# Patient Record
Sex: Male | Born: 1950 | Race: Black or African American | Hispanic: No | Marital: Married | Smoking: Former smoker
Health system: Southern US, Community
[De-identification: ages and names within clinical notes are randomized; demographics above are authoritative.]

## PROBLEM LIST (undated history)

## (undated) DIAGNOSIS — C051 Malignant neoplasm of soft palate: Secondary | ICD-10-CM

## (undated) DIAGNOSIS — K219 Gastro-esophageal reflux disease without esophagitis: Secondary | ICD-10-CM

## (undated) DIAGNOSIS — T85848A Pain due to other internal prosthetic devices, implants and grafts, initial encounter: Secondary | ICD-10-CM

## (undated) DIAGNOSIS — I10 Essential (primary) hypertension: Secondary | ICD-10-CM

## (undated) DIAGNOSIS — N189 Chronic kidney disease, unspecified: Secondary | ICD-10-CM

## (undated) DIAGNOSIS — C61 Malignant neoplasm of prostate: Secondary | ICD-10-CM

## (undated) DIAGNOSIS — I219 Acute myocardial infarction, unspecified: Secondary | ICD-10-CM

## (undated) DIAGNOSIS — Z923 Personal history of irradiation: Secondary | ICD-10-CM

## (undated) DIAGNOSIS — J449 Chronic obstructive pulmonary disease, unspecified: Secondary | ICD-10-CM

## (undated) DIAGNOSIS — I4891 Unspecified atrial fibrillation: Secondary | ICD-10-CM

## (undated) DIAGNOSIS — E039 Hypothyroidism, unspecified: Secondary | ICD-10-CM

## (undated) DIAGNOSIS — I739 Peripheral vascular disease, unspecified: Secondary | ICD-10-CM

## (undated) DIAGNOSIS — I6521 Occlusion and stenosis of right carotid artery: Secondary | ICD-10-CM

## (undated) DIAGNOSIS — I509 Heart failure, unspecified: Secondary | ICD-10-CM

## (undated) DIAGNOSIS — C3490 Malignant neoplasm of unspecified part of unspecified bronchus or lung: Secondary | ICD-10-CM

## (undated) DIAGNOSIS — D509 Iron deficiency anemia, unspecified: Secondary | ICD-10-CM

## (undated) DIAGNOSIS — I6529 Occlusion and stenosis of unspecified carotid artery: Secondary | ICD-10-CM

## (undated) HISTORY — PX: TRANSURETHRAL RESECTION OF BLADDER TUMOR WITH GYRUS (TURBT-GYRUS): SHX6458

## (undated) HISTORY — DX: Occlusion and stenosis of unspecified carotid artery: I65.29

## (undated) HISTORY — PX: CATARACT EXTRACTION: SUR2

---

## 1979-12-31 HISTORY — PX: AORTO-FEMORAL BYPASS GRAFT: SHX885

## 1997-12-30 DIAGNOSIS — C051 Malignant neoplasm of soft palate: Secondary | ICD-10-CM

## 1997-12-30 DIAGNOSIS — Z923 Personal history of irradiation: Secondary | ICD-10-CM

## 1997-12-30 HISTORY — DX: Malignant neoplasm of soft palate: C05.1

## 1997-12-30 HISTORY — DX: Personal history of irradiation: Z92.3

## 1998-08-22 ENCOUNTER — Emergency Department (HOSPITAL_COMMUNITY): Admission: EM | Admit: 1998-08-22 | Discharge: 1998-08-22 | Payer: Self-pay | Admitting: Emergency Medicine

## 2000-12-30 HISTORY — PX: PSEUDOANEURYSM REPAIR: SHX2272

## 2001-10-09 ENCOUNTER — Emergency Department (HOSPITAL_COMMUNITY): Admission: EM | Admit: 2001-10-09 | Discharge: 2001-10-09 | Payer: Self-pay | Admitting: Emergency Medicine

## 2001-10-11 ENCOUNTER — Emergency Department (HOSPITAL_COMMUNITY): Admission: EM | Admit: 2001-10-11 | Discharge: 2001-10-11 | Payer: Self-pay

## 2002-08-10 ENCOUNTER — Encounter: Payer: Self-pay | Admitting: Internal Medicine

## 2002-08-10 ENCOUNTER — Ambulatory Visit (HOSPITAL_COMMUNITY): Admission: RE | Admit: 2002-08-10 | Discharge: 2002-08-10 | Payer: Self-pay | Admitting: Internal Medicine

## 2002-09-25 DIAGNOSIS — I509 Heart failure, unspecified: Secondary | ICD-10-CM

## 2002-09-25 DIAGNOSIS — I219 Acute myocardial infarction, unspecified: Secondary | ICD-10-CM

## 2002-09-25 HISTORY — DX: Heart failure, unspecified: I50.9

## 2002-09-25 HISTORY — DX: Acute myocardial infarction, unspecified: I21.9

## 2005-06-24 ENCOUNTER — Emergency Department (HOSPITAL_COMMUNITY): Admission: EM | Admit: 2005-06-24 | Discharge: 2005-06-24 | Payer: Self-pay | Admitting: Emergency Medicine

## 2005-06-29 ENCOUNTER — Emergency Department (HOSPITAL_COMMUNITY): Admission: EM | Admit: 2005-06-29 | Discharge: 2005-06-29 | Payer: Self-pay | Admitting: Emergency Medicine

## 2006-01-10 ENCOUNTER — Emergency Department (HOSPITAL_COMMUNITY): Admission: EM | Admit: 2006-01-10 | Discharge: 2006-01-10 | Payer: Self-pay | Admitting: Emergency Medicine

## 2008-04-04 ENCOUNTER — Ambulatory Visit (HOSPITAL_COMMUNITY): Admission: RE | Admit: 2008-04-04 | Discharge: 2008-04-04 | Payer: Self-pay | Admitting: Internal Medicine

## 2008-08-22 ENCOUNTER — Encounter: Payer: Self-pay | Admitting: Pulmonary Disease

## 2008-08-22 ENCOUNTER — Ambulatory Visit: Payer: Self-pay | Admitting: Internal Medicine

## 2008-08-23 ENCOUNTER — Encounter: Payer: Self-pay | Admitting: Pulmonary Disease

## 2008-08-29 ENCOUNTER — Ambulatory Visit: Payer: Self-pay | Admitting: Pulmonary Disease

## 2008-08-29 DIAGNOSIS — Z9189 Other specified personal risk factors, not elsewhere classified: Secondary | ICD-10-CM | POA: Insufficient documentation

## 2008-08-29 DIAGNOSIS — R0602 Shortness of breath: Secondary | ICD-10-CM | POA: Insufficient documentation

## 2008-08-29 DIAGNOSIS — C760 Malignant neoplasm of head, face and neck: Secondary | ICD-10-CM

## 2008-09-08 ENCOUNTER — Emergency Department (HOSPITAL_COMMUNITY): Admission: EM | Admit: 2008-09-08 | Discharge: 2008-09-08 | Payer: Self-pay | Admitting: Emergency Medicine

## 2010-10-08 ENCOUNTER — Emergency Department (HOSPITAL_COMMUNITY)
Admission: EM | Admit: 2010-10-08 | Discharge: 2010-10-08 | Payer: Self-pay | Source: Home / Self Care | Admitting: Emergency Medicine

## 2011-03-14 LAB — POCT CARDIAC MARKERS
CKMB, poc: <1 ng/mL — ABNORMAL LOW (ref 1.0–8.0)
Troponin i, poc: <0.05 ng/mL (ref 0.00–0.09)

## 2011-03-14 LAB — BASIC METABOLIC PANEL
Calcium: 8.8 mg/dL (ref 8.4–10.5)
Creatinine, Ser: 0.8 mg/dL (ref 0.4–1.5)
GFR calc Af Amer: >60 mL/min (ref >60)

## 2011-03-14 LAB — CBC
Platelets: 246 10*3/uL (ref 150–400)
RBC: 4.02 MIL/uL — ABNORMAL LOW (ref 4.22–5.81)
WBC: 6.5 10*3/uL (ref 4.0–10.5)

## 2011-05-17 NOTE — Consult Note (Signed)
NAME:  Bryan Arellano, Bryan Arellano                          ACCOUNT NO.:  [id number]   MEDICAL RECORD NO.:  [id number]                   PATIENT TYPE:  OUT   LOCATION:  XRAY                                 FACILITY:  [facility]   PHYSICIAN:  James L. Deterding, M.D.            DATE OF BIRTH:  1951/06/03   DATE OF CONSULTATION:  DATE OF DISCHARGE:  08/10/2002                                   CONSULTATION   CONSULTATIONS:  [facility].   REASON FOR CONSULTATION:  Acute and chronic renal insufficiency.   HISTORY OF PRESENT ILLNESS:  This is an 60 year old gentleman who has a  longstanding history of peripheral vascular disease status-post aorta  femoral bypass in 1981, history of pseudoaneurysm resection in 2002, history  of DJD, hypertension, hypothyroidism, history of lung cancer in the distant  past, history of transurethral resection for bladder carcinoma in the past,  history of cataract surgery, history of gastroesophageal reflux disease,  peptic ulcer disease, COPD, hypertension, dyslipidemia. He does not know  about the history of hypertension, but thinks he might be.   He presented on September 25, 2002, with an acute MI and congestive heart  failure. His creatinine was 3.3 on admission and has now settled at 2.4. His  creatinine was 1.7 in August 2002. He has a history of being on Univasc at  home and no nonsteroidal ingestion. 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  Previously he had ongoing  angina and need for catheterization. He has a  positive family history of diabetes, of CVA, COPD. His mother had renal  disease; we do know the definition of that in terms of severity and cause.   REVIEW OF SYSTEMS:  HEENT:  He says he can see fairly well and read  pretty  well and does have some blurring of vision but not on a severe basis. No  headaches. CARDIOVASCULAR:  He had the chest pain when he came in with  congestive heart failure. He usually sleeps on two pillows. Has nocturia x 4  to 5 at home. No dysuria. He notes no ankle edema most recently. He has had  no PND or orthopnea most recently. Previously acute onset of chest pain as  presented in initial history.  EXTREMITIES:  His legs do get tired, but he thinks it is mostly joints that  bother him when he walks about one to two blocks. He rests 10 minutes and it  is better, however. GI: He has indigestion and heart burn. He takes Pepcid  on a regular basis without any bloody or black stools. He is constipated  here. MUSCULOSKELETAL:  He has pain in his hands, hips, knees that has been  a chronic problem. He takes Tylenol on an intermittent basis for that. No  nonsteroidals. SKIN:  He notes that he bruises quite easily and thinks this  related  to the blood thinner here, but at home he has some bruises but not  like he does here.  PSYCHOLOGICAL:  Negative.   FAMILY HISTORY:  As noted above. Diabetes, CVA, COPD, CHF, two siblings with  coronary  disease.  One born with cancer. One who died of an unknown  cause.   SOCIAL HISTORY:  He  discontinued smoking 20 years ago. He is retired from  [employer]. Occasional alcohol in the past,  none recently.   MEDICATIONS:  Included on admission:  1. Univasc 15 mg.  2. __________ 19 mg q.d.  3. Verapamil 120 mg q.d.  4. __________ 0.l mg q.d.  5. Pepcid 20 mg b.i.d.  6. Benadryl 50 mg q.h.s.  7. Enteric coated aspirin 81 mg three days a week.  8. Simvastatin 40 mg q.d.  9. Vitamin C 1 gm q.d.   PHYSICAL EXAMINATION:  GENERAL:  A very pleasant elderly gentleman who is a  somewhat vague historian.  VITAL SIGNS:  Blood pressure 134/58, going to 118/50.  HEENT:  Fundi are benign. Pharynx unremarkable.  NECK:  Shotty posterior cervical adenopathy. He has bilateral carotid  bruits.  CARDIOVASCULAR:  Regular rhythm, S4, a grade 2/6 holosystolic murmur heard  best at the apex. A 2/6 diastolic murmur  heard best at the upper left  sternal border. Bilateral  femoral bruits. He has midline abdominal,  bilateral, frank bruits louder on the left than on the right. Bilateral  carotid bruits. He has a right femoral pseudoaneurysm.  LUNGS:  Reveal no rales, rhonchi or wheezes. Breath sounds are decreased.  Decreased expansion.  ABDOMEN:  Positive bowel sounds, soft.  SKIN:  Shows some bruises.  He has some seborrheic keratosis. He also has a  sebaceous cyst on his back.  MUSCULOSKELETAL:  Reveals hypertrophic changes in the hands. He has trophic  changes in both feet in terms of __________  appendages. No dorsalis pedis  pulses found.   LABORATORY DATA:  Hemoglobin was 8.4, white count 10,700, platelets 111,000.  Iron sat 11%. Sodium 138, potassium 4, chloride 105,  bicarbonate 22,  creatinine 2.4, BUN 71, glucose 99.   ASSESSMENT:  1. Chronic renal failure and acute renal failure. Chronic disease is mostly     likely nephrosclerosis or vascular disease, based on his creatinine of     1.7 in August 2002, which I  expect to be worse now. Creatinine clearance     was a creatinine of 1.7, for him it is only 39 cc. Creatinine clearance     showed a creatinine of 2.6 that is 25 cc a minute. He has severe disease,     class 4 at best by the NKF classification. We need to rule out     obstruction, dysprotienuria or tubular interstitial disease also.   His acute renal disease is secondary to decreased renal blood flow in the  setting of CHF, which improved at diuresis. Question if an ACE inhibitor  had some role in prohibiting his autoregulatory reaction to the congestive  heart failure, and he has some chronic problem also, and he may get a little  better yet.   Very low GFR contributing it to his anemia, acidosis, need to rule out  secondary to hypothyroidism. The risk for catheterization is over 25% with  worsening of renal function or he could die of atheroembolic disease.  He does need  the catheterization.  1. Acute MI. He needs the catheterization ongoing instent arrhythmias.  2. Anemia, iron deficiency  as well as chronic disease from his renal disease     and need for iron at this time and EPO.  3. Peripheral vascular disease, rule out renal artery stenosis.  4. COPD.  5. History of prostate carcinoma.  6. History of lung cancer.   PLAN:  1. Ultrasound.  2. Repeat urinalysis.  3. Urine sodium creatinine.  4. Hold his ACE inhibitor.  5.     Check a PTH.  6. Intravenous  iron.  7. Check his stools.                                               James L. Deterding, M.D.    JLD/MEDQ  D:  09/29/2002  T:  10/03/2002  Job:  [id number]

## 2011-09-12 ENCOUNTER — Other Ambulatory Visit: Payer: Self-pay | Admitting: Internal Medicine

## 2011-09-12 ENCOUNTER — Ambulatory Visit
Admission: RE | Admit: 2011-09-12 | Discharge: 2011-09-12 | Disposition: A | Discharge status: Home / Self Care | Payer: Self-pay | Source: Ambulatory Visit | Attending: Internal Medicine | Admitting: Internal Medicine

## 2011-09-12 DIAGNOSIS — R0600 Dyspnea, unspecified: Secondary | ICD-10-CM

## 2011-09-12 MED ORDER — IOHEXOL 300 MG/ML  SOLN
75.0000 mL | Freq: Once | INTRAMUSCULAR | Status: AC | PRN
  Administered 2011-09-12: 75 mL via INTRAVENOUS

## 2011-09-18 ENCOUNTER — Other Ambulatory Visit: Payer: Self-pay | Admitting: Internal Medicine

## 2011-09-18 DIAGNOSIS — R591 Generalized enlarged lymph nodes: Secondary | ICD-10-CM

## 2011-09-18 DIAGNOSIS — R0602 Shortness of breath: Secondary | ICD-10-CM

## 2011-09-19 ENCOUNTER — Ambulatory Visit
Admission: RE | Admit: 2011-09-19 | Discharge: 2011-09-19 | Disposition: A | Discharge status: Home / Self Care | Payer: Medicare Other | Source: Ambulatory Visit | Attending: Internal Medicine | Admitting: Internal Medicine

## 2011-09-19 DIAGNOSIS — R591 Generalized enlarged lymph nodes: Secondary | ICD-10-CM

## 2011-09-19 DIAGNOSIS — R0602 Shortness of breath: Secondary | ICD-10-CM

## 2011-09-19 MED ORDER — IOHEXOL 300 MG/ML  SOLN
100.0000 mL | Freq: Once | INTRAMUSCULAR | Status: AC | PRN
  Administered 2011-09-19: 100 mL via INTRAVENOUS

## 2011-12-31 HISTORY — PX: GASTROSTOMY TUBE PLACEMENT: SHX655

## 2012-01-07 ENCOUNTER — Other Ambulatory Visit (HOSPITAL_COMMUNITY): Payer: Self-pay | Admitting: *Deleted

## 2012-01-08 ENCOUNTER — Other Ambulatory Visit (HOSPITAL_COMMUNITY): Payer: Medicare Other

## 2012-01-08 ENCOUNTER — Ambulatory Visit (HOSPITAL_COMMUNITY)
Admission: RE | Admit: 2012-01-08 | Discharge: 2012-01-08 | Disposition: A | Discharge status: Home / Self Care | Payer: Medicare Other | Source: Ambulatory Visit | Attending: Internal Medicine | Admitting: Internal Medicine

## 2012-01-08 DIAGNOSIS — R1310 Dysphagia, unspecified: Secondary | ICD-10-CM | POA: Insufficient documentation

## 2012-01-08 DIAGNOSIS — R1313 Dysphagia, pharyngeal phase: Secondary | ICD-10-CM | POA: Insufficient documentation

## 2012-01-08 NOTE — Procedures (Signed)
Modified Barium Swallow Procedure Note Patient Details  Name: Bryan Arellano MRN: [id number] Date of Birth: 23-May-1951  Today's Date: 01/08/2012 Time:  -     Past Medical History: No past medical history on file. Past Surgical History: No past surgical history on file. HPI:   HPI: Pt is a 61 year old male arriving for an outpatient MBS due to complaints that food gets stuck and is then expectorated. 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 She also admits that the pts has been very weak over the last three weeks and became suddenly unable to swallow over this time. The pt does not demonstrate any signs of focal facial, lingual, labial or UE weakness. His velum appears flaccid on the right and does not make contact with the posterior pharyngeal wall.   Recommendation/Prognosis  Clinical Impression Dysphagia Diagnosis: Severe pharyngeal phase dysphagia Clinical impression: Pt presents with a severe pharyngeal dysphagia characterized by sensory motor deficits. Pt with reduced laryngeal elevation and excursion, base of tongue retraction and epiglottic inversion. Liquids fall to pyriform sinuses and are aspirated before swallow initiated and significant vallecualr residuals aspirated after the swallow. Gross aspiration is silent and not fully expelled even with a hard cough. Wtih nectar thick liquids only trace penetration occured from residuals after the swallow. Residuals improve with a head turn to the right. Feel this pt with a new neurological deficit resulting in an increase in a possible mild baseline dysphagia. Pt with severe aspiration risk and will need to consume nectar thick liquids and soft (dysphagia 3) solids with  a head turn to the right. Pt recommended to follow up with MD for further assessment.   Swallow Recommendations  Solid Consistency: Dysphagia 3 (Mechanical soft)  Liquid Consistency: Nectar  Liquid Administration via: Cup  Medication Administration: Whole meds with liquid  Supervision: Patient able to self feed  Compensations: Slow rate;Small sips/bites;Multiple dry swallows after each bite/sip;Clear throat intermittently  Postural Changes and/or Swallow Maneuvers: Seated upright 90 degrees;Head turn right during swallow  Follow up Recommendations: Home health SLP Prognosis Prognosis for Safe Diet Advancement: Fair Barriers to Reach Goals: Cognitive deficits;Severity of dysphagia Individuals Consulted Consulted and Agree with Results and Recommendations: Patient;Family member/caregiver;MD Family Member Consulted: wife Report Sent to : Referring physician  General:   Type of Study: Initial MBS Diet Prior to this Study: Dysphagia 3 (soft);Thin liquids Temperature Spikes Noted: No Respiratory Status: Room air Behavior/Cognition: Alert;Cooperative Oral Cavity - Dentition: Edentulous Oral Motor / Sensory Function: Impaired motor Oral impairment:  (right side of velum) Vision: Functional for self-feeding Patient Positioning: Upright in chair Baseline Vocal Quality: Other (comment) (hypernasal resonance) Volitional Cough: Strong Volitional Swallow: Able to elicit Pharyngeal Secretions: Standing secretions in (comment) (posterior pharynx) Ice chips: Not tested   Oral Phase Oral Preparation/Oral Phase Oral Phase: WFL Pharyngeal Phase  Pharyngeal Phase Pharyngeal Phase: Impaired Pharyngeal - Nectar Pharyngeal - Nectar Cup: Delayed swallow initiation;Reduced pharyngeal peristalsis;Reduced epiglottic inversion;Reduced anterior laryngeal mobility;Reduced laryngeal elevation;Reduced airway/laryngeal closure;Reduced tongue base retraction;Penetration/Aspiration after swallow;Pharyngeal  residue - valleculae;Pharyngeal residue - posterior pharnyx;Compensatory strategies attempted (Comment) (Head turn to right) Penetration/Aspiration details (nectar cup): Material enters airway, remains ABOVE vocal cords and not ejected out Pharyngeal - Thin  Pharyngeal - Thin Cup: Delayed swallow initiation;Premature spillage to pyriform;Reduced airway/laryngeal closure;Reduced laryngeal elevation;Reduced anterior laryngeal mobility;Reduced pharyngeal peristalsis;Reduced epiglottic inversion;Reduced tongue base retraction;Penetration/Aspiration before swallow;Penetration/Aspiration during swallow;Significant aspiration (Amount);Pharyngeal residue - valleculae;Pharyngeal residue - posterior pharnyx;Compensatory strategies attempted (Comment) (chin tuck, head turn left, head turn right) Penetration/Aspiration details (thin cup): Material enters airway, passes BELOW cords without attempt by patient to eject out (silent aspiration) Pharyngeal - Solids Pharyngeal - Puree: Pharyngeal residue - valleculae;Pharyngeal residue - posterior pharnyx;Reduced anterior laryngeal mobility;Reduced laryngeal elevation;Reduced airway/laryngeal closure;Reduced tongue base retraction;Reduced epiglottic inversion;Reduced pharyngeal peristalsis;Compensatory strategies attempted (Comment) (head turn right) Pharyngeal - Mechanical Soft: Reduced epiglottic inversion;Reduced anterior laryngeal mobility;Reduced laryngeal elevation;Reduced pharyngeal peristalsis;Reduced airway/laryngeal closure;Reduced tongue base retraction;Pharyngeal residue - valleculae;Pharyngeal residue - posterior pharnyx (head turn right) Pharyngeal - Pill: Reduced pharyngeal peristalsis;Reduced epiglottic inversion;Reduced anterior laryngeal mobility;Reduced laryngeal elevation;Reduced airway/laryngeal closure;Reduced tongue base retraction Cervical Esophageal Phase  Cervical Esophageal Phase Cervical Esophageal Phase: [facility]   Harlon Ditty, MA CCC-SLP  [phone]   Jiali Linney, Riley Nearing 01/08/2012, 1:55 PM

## 2012-02-27 ENCOUNTER — Ambulatory Visit
Admission: RE | Admit: 2012-02-27 | Discharge: 2012-02-27 | Disposition: A | Discharge status: Home / Self Care | Payer: Medicare Other | Source: Ambulatory Visit | Attending: Internal Medicine | Admitting: Internal Medicine

## 2012-02-27 ENCOUNTER — Other Ambulatory Visit: Payer: Self-pay | Admitting: Internal Medicine

## 2012-02-27 MED ORDER — IOHEXOL 300 MG/ML  SOLN
100.0000 mL | Freq: Once | INTRAMUSCULAR | Status: AC | PRN
  Administered 2012-02-27: 100 mL via INTRAVENOUS

## 2012-02-27 MED ORDER — IOHEXOL 300 MG/ML  SOLN
30.0000 mL | Freq: Once | INTRAMUSCULAR | Status: AC | PRN
  Administered 2012-02-27: 30 mL via ORAL

## 2012-03-24 ENCOUNTER — Encounter: Payer: Medicare Other | Admitting: *Deleted

## 2012-03-25 ENCOUNTER — Ambulatory Visit: Payer: Medicare Other | Attending: Internal Medicine

## 2012-03-25 DIAGNOSIS — R1313 Dysphagia, pharyngeal phase: Secondary | ICD-10-CM | POA: Insufficient documentation

## 2012-03-25 DIAGNOSIS — IMO0001 Reserved for inherently not codable concepts without codable children: Secondary | ICD-10-CM | POA: Insufficient documentation

## 2012-03-30 ENCOUNTER — Ambulatory Visit: Payer: Medicare Other | Attending: Internal Medicine | Admitting: Speech Pathology

## 2012-03-30 DIAGNOSIS — R1313 Dysphagia, pharyngeal phase: Secondary | ICD-10-CM | POA: Insufficient documentation

## 2012-03-30 DIAGNOSIS — IMO0001 Reserved for inherently not codable concepts without codable children: Secondary | ICD-10-CM | POA: Insufficient documentation

## 2012-04-01 ENCOUNTER — Ambulatory Visit: Payer: Medicare Other

## 2012-04-03 ENCOUNTER — Ambulatory Visit: Payer: Medicare Other

## 2012-04-06 ENCOUNTER — Ambulatory Visit: Payer: Medicare Other

## 2012-04-08 ENCOUNTER — Ambulatory Visit: Payer: Medicare Other

## 2012-04-10 ENCOUNTER — Ambulatory Visit: Payer: Medicare Other

## 2012-04-13 ENCOUNTER — Ambulatory Visit: Payer: Medicare Other | Admitting: *Deleted

## 2012-04-15 ENCOUNTER — Ambulatory Visit: Payer: Medicare Other | Admitting: *Deleted

## 2012-04-15 ENCOUNTER — Emergency Department (HOSPITAL_COMMUNITY)
Admission: EM | Admit: 2012-04-15 | Discharge: 2012-04-16 | Disposition: A | Discharge status: Home / Self Care | Payer: Medicare Other | Attending: Emergency Medicine | Admitting: Emergency Medicine

## 2012-04-15 ENCOUNTER — Encounter (HOSPITAL_COMMUNITY): Payer: Self-pay | Admitting: Emergency Medicine

## 2012-04-15 DIAGNOSIS — R109 Unspecified abdominal pain: Secondary | ICD-10-CM | POA: Insufficient documentation

## 2012-04-15 DIAGNOSIS — R1900 Intra-abdominal and pelvic swelling, mass and lump, unspecified site: Secondary | ICD-10-CM | POA: Insufficient documentation

## 2012-04-15 DIAGNOSIS — Z931 Gastrostomy status: Secondary | ICD-10-CM | POA: Insufficient documentation

## 2012-04-15 LAB — HEPATIC FUNCTION PANEL
ALT: 20 U/L (ref 0–53)
Alkaline Phosphatase: 82 U/L (ref 39–117)
Bilirubin, Direct: <0.1 mg/dL (ref 0.0–0.3)

## 2012-04-15 MED ORDER — HYDROMORPHONE HCL PF 1 MG/ML IJ SOLN
1.0000 mg | Freq: Once | INTRAMUSCULAR | Status: AC
  Administered 2012-04-15: 1 mg via INTRAVENOUS
  Filled 2012-04-15: qty 1

## 2012-04-15 MED ORDER — SODIUM CHLORIDE 0.9 % IV BOLUS (SEPSIS)
1000.0000 mL | Freq: Once | INTRAVENOUS | Status: AC
  Administered 2012-04-15: 1000 mL via INTRAVENOUS

## 2012-04-15 MED ORDER — ONDANSETRON HCL 4 MG/2ML IJ SOLN
4.0000 mg | Freq: Once | INTRAMUSCULAR | Status: AC
  Administered 2012-04-15: 4 mg via INTRAVENOUS
  Filled 2012-04-15: qty 2

## 2012-04-15 NOTE — ED Provider Notes (Signed)
History     CSN: [id number]  Arrival date & time 04/15/12  2110   First MD Initiated Contact with Patient 04/15/12 2320      Chief Complaint  Patient presents with  . Abdominal Pain    (Consider location/radiation/quality/duration/timing/severity/associated sxs/prior treatment) HPI Comments: Patient presents with 4 days of abdominal pain and swelling around his gastrostomy tube site. His pain is around the tube in his upper abdomen. He denies any nausea, vomiting, fever, chills. He is still moving his bowels. He has a gastrostomy tube for of swallowing difficulties and has a remote history oral cancer. No fevers, chest pain or shortness of breath. He is to still tolerating his tube feeds. There's been no bleeding or drainage from around the G-tube site  The history is provided by the patient and a relative.    History reviewed. No pertinent past medical history.  Past Surgical History  Procedure Date  . Gastrostomy tube placement     No family history on file.  History  Substance Use Topics  . Smoking status: Never Smoker   . Smokeless tobacco: Not on file  . Alcohol Use: No      Review of Systems  Constitutional: Negative for fever, activity change and appetite change.  HENT: Negative for congestion and rhinorrhea.   Respiratory: Negative for cough, chest tightness and shortness of breath.   Cardiovascular: Negative for chest pain.  Gastrointestinal: Positive for abdominal pain. Negative for nausea, vomiting, diarrhea and constipation.  Genitourinary: Negative for dysuria and hematuria.  Musculoskeletal: Negative for back pain.  Skin: Negative for rash.  Neurological: Negative for dizziness, weakness and headaches.    Allergies  Review of patient's allergies indicates no known allergies.  Home Medications   Current Outpatient Rx  Name Route Sig Dispense Refill  . HYDROCODONE-ACETAMINOPHEN 5-325 MG PO TABS Oral Take 2 tablets by mouth every 4 (four) hours as  needed for pain. 10 tablet 0  . ONDANSETRON HCL 4 MG PO TABS Oral Take 1 tablet (4 mg total) by mouth every 6 (six) hours. 12 tablet 0    BP 153/85  Pulse 72  Temp(Src) 97 F (36.1 C) (Oral)  Resp 16  SpO2 100%  Physical Exam  Constitutional: He is oriented to person, place, and time. He appears well-developed and well-nourished. No distress.  HENT:  Head: Normocephalic and atraumatic.  Mouth/Throat: Oropharynx is clear and moist. No oropharyngeal exudate.  Eyes: Conjunctivae and EOM are normal. Pupils are equal, round, and reactive to light.  Neck: Normal range of motion. Neck supple.  Cardiovascular: Normal rate, regular rhythm and normal heart sounds.   No murmur heard. Pulmonary/Chest: Effort normal and breath sounds normal. No respiratory distress.  Abdominal: Soft. There is tenderness. There is guarding.       Upper quadrant G-tube without drainage or erythema. Tender to palpation around the site diffusely  Musculoskeletal: Normal range of motion. He exhibits no edema and no tenderness.  Neurological: He is alert and oriented to person, place, and time. No cranial nerve deficit.  Skin: Skin is warm.    ED Course  Procedures (including critical care time)  Labs Reviewed  CBC - Abnormal; Notable for the following:    RBC 4.03 (*)    Hemoglobin 12.9 (*)    HCT 37.8 (*)    All other components within normal limits  HEPATIC FUNCTION PANEL  LIPASE, BLOOD  LACTIC ACID, PLASMA  URINALYSIS, ROUTINE W REFLEX MICROSCOPIC  BASIC METABOLIC PANEL  DIFFERENTIAL   Ct  Abdomen Pelvis W Contrast  04/16/2012  *RADIOLOGY REPORT*  Clinical Data: Abdominal swelling for 4 days.  CT ABDOMEN AND PELVIS WITH CONTRAST  Technique:  Multidetector CT imaging of the abdomen and pelvis was performed following the standard protocol during bolus administration of intravenous contrast.  Contrast: 80mL OMNIPAQUE IOHEXOL 300 MG/ML  SOLN  Comparison: CT of the abdomen and pelvis performed 02/27/2012   Findings: Minimal bibasilar atelectasis is noted.  Scattered tiny hypodensities within the liver are too small to further characterize, but may reflect cysts.  These measure up to 6 mm in size.  There is mild nonspecific prominence of the intrahepatic biliary ducts, new from the prior study, without evidence of distal obstruction.  The gallbladder is grossly unremarkable in appearance.  The spleen is within normal limits. The pancreas and adrenal glands are grossly unremarkable.  Scattered small bilateral renal cysts are seen, measuring up to 1.4 cm in size.  The kidneys are otherwise unremarkable in appearance. There is no evidence of hydronephrosis.  No renal or ureteral stones are seen.  A right-sided extrarenal pelvis is seen.  No free fluid is identified.  The small bowel is unremarkable in appearance.  Apparent mild focal gastric wall thickening at the antrum is nonspecific and may simply reflect relative decompression; the patient's G-tube is noted ending at the body of the stomach.  No acute vascular abnormalities are seen.  Diffuse calcification is noted along the abdominal aorta and its branches.  The appendix is normal in caliber and contains contrast, without evidence for appendicitis.  Contrast progresses to the level of the distal transverse colon.  The sigmoid colon is not well assessed due to surrounding bowel structures, but appears grossly unremarkable; the remaining colon is within normal limits.  There is mild soft tissue inflammation noted along the anterior abdominal wall bilaterally, slightly more prominent on the right; this is more prominent than on the prior study, without evidence for abscess.  The bladder is mildly distended; mild diffuse bladder wall thickening could reflect cystitis.  The prostate is borderline normal in size.  No inguinal lymphadenopathy is seen.  No acute osseous abnormalities are identified.  Vacuum phenomenon is noted along the lower lumbar spine.  IMPRESSION:  1.   Slightly more prominent soft tissue inflammation noted along the anterior abdominal wall bilaterally, more evident on the right. No evidence of abscess. 2.  Apparent mild focal gastric wall thickening at the antrum is nonspecific and may simply reflect relative decompression, though mild chronic gastritis could have a similar appearance.  This appearance is stable from prior CTs.  Mass is considered less likely. 3.  Mild bladder wall thickening is relatively stable in appearance, but could reflect mild cystitis or possibly chronic inflammation. 4.  Likely small hepatic cysts again noted; small bilateral renal cysts seen. 5.  Mild nonspecific prominence of the intrahepatic biliary ducts, new from the prior study, without evidence of distal obstruction. 6.  Diffuse calcification along the abdominal aorta and its branches.  Original Report Authenticated By: Tonia Ghent, M.D.     1. Abdominal pain       MDM  Abdominal pain around G-tube site without any nausea or vomiting or diarrhea. Vitals stable, no distress  Lab work unremarkable, urinalysis negative.  CT scan shows soft tissue inflammation of the anterior abdominal wall without clinical evidence of abscess or cellulitis no bowel obstruction. No abscess. No fever or leukocytosis.  Patient has remained stable in the ED. I discussed these results with the patient  and his family and advised followup with his GI doctor in [place].      Glynn Octave, MD 04/16/12 [phone]

## 2012-04-15 NOTE — ED Notes (Signed)
PT. REPORTS ABDOMINAL PAIN / SWELLING AT G-TUBE SITE FOR SEVERAL DAYS , DENIES NAUSEA OR VOMITTING / NO FEVER OR CHILLS.

## 2012-04-15 NOTE — ED Notes (Signed)
Patient's family member out in waiting room yelling at staff; family member upset that patient has been sent back out in waiting room to sit with family rather than put in an exam room.  Family member informed that patient is welcome to sit back in triage area with triage RN until room is available.  Family member requesting pain medication for patient; informed family member that patient must be seen by MD before any medications are given.  Family member became more upset when she was asked to move from check-in registration chairs to the main waiting area -- states, "It's already too crowded over there.  Why do I have to move"?  Asked family member if she would like to speak with charge RN; family member refused.  Will continue to monitor.

## 2012-04-16 ENCOUNTER — Emergency Department (HOSPITAL_COMMUNITY): Payer: Medicare Other

## 2012-04-16 LAB — BASIC METABOLIC PANEL
CO2: 28 mEq/L (ref 19–32)
Calcium: 9.6 mg/dL (ref 8.4–10.5)
Creatinine, Ser: 0.67 mg/dL (ref 0.50–1.35)
Glucose, Bld: 85 mg/dL (ref 70–99)

## 2012-04-16 LAB — URINALYSIS, ROUTINE W REFLEX MICROSCOPIC
Bilirubin Urine: NEGATIVE
Glucose, UA: NEGATIVE mg/dL
Hgb urine dipstick: NEGATIVE
Ketones, ur: NEGATIVE mg/dL
Protein, ur: NEGATIVE mg/dL

## 2012-04-16 LAB — CBC
MCH: 32 pg (ref 26.0–34.0)
MCV: 93.8 fL (ref 78.0–100.0)
Platelets: 182 10*3/uL (ref 150–400)
RBC: 4.03 MIL/uL — ABNORMAL LOW (ref 4.22–5.81)
RDW: 13.6 % (ref 11.5–15.5)

## 2012-04-16 LAB — DIFFERENTIAL
Eosinophils Absolute: 0.1 10*3/uL (ref 0.0–0.7)
Eosinophils Relative: 1 % (ref 0–5)
Lymphs Abs: 1.7 10*3/uL (ref 0.7–4.0)

## 2012-04-16 MED ORDER — ONDANSETRON HCL 4 MG PO TABS: 4.0000 mg | ORAL_TABLET | Freq: Four times a day (QID) | ORAL | Status: AC

## 2012-04-16 MED ORDER — MORPHINE SULFATE 4 MG/ML IJ SOLN
4.0000 mg | Freq: Once | INTRAMUSCULAR | Status: AC
  Administered 2012-04-16: 4 mg via INTRAVENOUS
  Filled 2012-04-16: qty 1

## 2012-04-16 MED ORDER — IOHEXOL 300 MG/ML  SOLN: 20.0000 mL | INTRAMUSCULAR | Status: AC

## 2012-04-16 MED ORDER — IOHEXOL 300 MG/ML  SOLN
80.0000 mL | Freq: Once | INTRAMUSCULAR | Status: AC | PRN
  Administered 2012-04-16: 80 mL via INTRAVENOUS

## 2012-04-16 MED ORDER — HYDROCODONE-ACETAMINOPHEN 5-325 MG PO TABS: 2.0000 | ORAL_TABLET | ORAL | Status: AC | PRN

## 2012-04-16 NOTE — ED Notes (Signed)
Unable to locate correct attachment for patient G tube. Patients wife has gone home to get his personal attachment.  CT notified of delay

## 2012-04-16 NOTE — ED Notes (Signed)
Patient transported to CT 

## 2012-04-16 NOTE — Discharge Instructions (Signed)
Abdominal Pain Follow up with your stomach doctor in [place]. Return to the ED for other worsening symptoms. Abdominal pain can be caused by many things. Your caregiver decides the seriousness of your pain by an examination and possibly blood tests and X-rays. Many cases can be observed and treated at home. Most abdominal pain is not caused by a disease and will probably improve without treatment. However, in many cases, more time must pass before a clear cause of the pain can be found. Before that point, it may not be known if you need more testing, or if hospitalization or surgery is needed. HOME CARE INSTRUCTIONS   Do not take laxatives unless directed by your caregiver.   Take pain medicine only as directed by your caregiver.   Only take over-the-counter or prescription medicines for pain, discomfort, or fever as directed by your caregiver.   Try a clear liquid diet (broth, tea, or water) for as long as directed by your caregiver. Slowly move to a bland diet as tolerated.  SEEK IMMEDIATE MEDICAL CARE IF:   The pain does not go away.   You have a fever.   You keep throwing up (vomiting).   The pain is felt only in portions of the abdomen. Pain in the right side could possibly be appendicitis. In an adult, pain in the left lower portion of the abdomen could be colitis or diverticulitis.   You pass bloody or black tarry stools.  MAKE SURE YOU:   Understand these instructions.   Will watch your condition.   Will get help right away if you are not doing well or get worse.  Document Released: 09/25/2005 Document Revised: 12/05/2011 Document Reviewed: 08/03/2008 [facility] Patient Information 2012 [place], [place].

## 2012-04-17 ENCOUNTER — Ambulatory Visit: Payer: Medicare Other

## 2012-04-18 ENCOUNTER — Emergency Department (HOSPITAL_COMMUNITY)
Admission: EM | Admit: 2012-04-18 | Discharge: 2012-04-18 | Disposition: A | Discharge status: Home / Self Care | Payer: Medicare Other | Attending: Emergency Medicine | Admitting: Emergency Medicine

## 2012-04-18 ENCOUNTER — Encounter (HOSPITAL_COMMUNITY): Payer: Self-pay | Admitting: *Deleted

## 2012-04-18 DIAGNOSIS — K9429 Other complications of gastrostomy: Secondary | ICD-10-CM | POA: Insufficient documentation

## 2012-04-18 DIAGNOSIS — Y849 Medical procedure, unspecified as the cause of abnormal reaction of the patient, or of later complication, without mention of misadventure at the time of the procedure: Secondary | ICD-10-CM | POA: Insufficient documentation

## 2012-04-18 DIAGNOSIS — Z431 Encounter for attention to gastrostomy: Secondary | ICD-10-CM

## 2012-04-18 HISTORY — DX: Pain due to other internal prosthetic devices, implants and grafts, initial encounter: T85.848A

## 2012-04-18 MED ORDER — BACITRACIN ZINC 500 UNIT/GM EX OINT
TOPICAL_OINTMENT | CUTANEOUS | Status: AC
  Filled 2012-04-18: qty 1.8

## 2012-04-18 NOTE — ED Provider Notes (Signed)
History     CSN: [id number]  Arrival date & time 04/18/12  1541   First MD Initiated Contact with Patient 04/18/12 1614      Chief Complaint  Patient presents with  . Abdominal Pain    umbilical area    (Consider location/radiation/quality/duration/timing/severity/associated sxs/prior treatment) Patient is a 61 y.o. male presenting with abdominal pain. The history is provided by the patient and a relative.  Abdominal Pain The primary symptoms of the illness include abdominal pain.   patient here complaining of pain around his G-tube site. No erythema slight bloody drainage. G-tube was placed a year ago. No fever. G-tube is functioning properly. Bleeding has been controlled with 4 x 4's. Patient has had some intermittent abdominal distention, but no vomiting or diarrhea. He is resting comfortably at this time  Past Medical History  Diagnosis Date  . Cancer   . Pain around PEG tube site   . Radiation     Past Surgical History  Procedure Date  . Gastrostomy tube placement     History reviewed. No pertinent family history.  History  Substance Use Topics  . Smoking status: Former Smoker    Quit date: 04/18/1998  . Smokeless tobacco: Never Used  . Alcohol Use: No      Review of Systems  Gastrointestinal: Positive for abdominal pain.  All other systems reviewed and are negative.    Allergies  Review of patient's allergies indicates no known allergies.  Home Medications   Current Outpatient Rx  Name Route Sig Dispense Refill  . HYDROCODONE-ACETAMINOPHEN 5-325 MG PO TABS Oral Take 2 tablets by mouth every 4 (four) hours as needed for pain. 10 tablet 0  . OSMOLITE 1.2 CAL PO LIQD Per Tube Place 237 mLs into feeding tube 4 (four) times daily.    Marland Kitchen PROMOD PO LIQD PEG Tube 45 mLs by PEG Tube route 2 (two) times daily.    Marland Kitchen ONDANSETRON HCL 4 MG PO TABS Oral Take 1 tablet (4 mg total) by mouth every 6 (six) hours. 12 tablet 0    BP 123/74  Pulse 77  Temp(Src) 97.5  F (36.4 C) (Oral)  Resp 18  Wt 110 lb (49.896 kg)  SpO2 100%  Physical Exam  Nursing note and vitals reviewed. Constitutional: He is oriented to person, place, and time. Vital signs are normal. He appears well-developed and well-nourished.  Non-toxic appearance. No distress.  HENT:  Head: Normocephalic and atraumatic.  Eyes: Conjunctivae, EOM and lids are normal. Pupils are equal, round, and reactive to light.  Neck: Normal range of motion. Neck supple. No tracheal deviation present. No mass present.  Cardiovascular: Normal rate, regular rhythm and normal heart sounds.  Exam reveals no gallop.   No murmur heard. Pulmonary/Chest: Effort normal and breath sounds normal. No stridor. No respiratory distress. He has no decreased breath sounds. He has no wheezes. He has no rhonchi. He has no rales.  Abdominal: Soft. Normal appearance and bowel sounds are normal. He exhibits no distension. There is no tenderness. There is no rigidity, no rebound, no guarding and no CVA tenderness.       Granulation tissue around the G-tube with slight bloody drainage. No surrounding erythema. No crepitus.  Musculoskeletal: Normal range of motion. He exhibits no edema and no tenderness.  Neurological: He is alert and oriented to person, place, and time. He has normal strength. No cranial nerve deficit or sensory deficit. GCS eye subscore is 4. GCS verbal subscore is 5. GCS motor subscore is  6.  Skin: Skin is warm and dry. No abrasion and no rash noted.  Psychiatric: He has a normal mood and affect. His speech is normal and behavior is normal.    ED Course  Procedures (including critical care time)  Labs Reviewed - No data to display No results found.   No diagnosis found.    MDM  Patient without signs of serious infection this time. Wound has been dressed with topical antibiotics and patient will followup with his gastroenterologist next week        Toy Baker, MD 04/18/12 1642

## 2012-04-18 NOTE — Discharge Instructions (Signed)
Apply topical antibiotic ointment around the G-tube site twice a day. Followup with your doctors at [facility] next week return here for fever, abdominal distention, or vomiting Gastrostomy Tube, Adult A gastrostomy tube is a tube that is placed into the stomach. It is also called a "G-tube." This tube is used for:  Feeding.   Giving medication.  CLEANING THE G-TUBE SITE  Wash your hands with soap and water.   Remove the old dressing (if any). Some styles of G-tubes may need a dressing inserted between the skin and the G-tube. Other types of G-tubes do not require a dressing. Ask your caregiver if a dressing is needed.   Check the area where the tube enters the skin (insertion site) for redness, swelling, or pus-like (purulent) drainage. A small amount of clear or tan liquid drainage is normal. Check to make sure scar tissue (skin) is not growing around the insertion site. This could have a raised, bumpy appearance.   A cotton swab can be used to clean the skin around the tube:   When the G-tube is first put in, a normal saline solution or water can be used to clean the skin.   Mild soap and warm water can be used when the skin around the G-tube site has healed.   Roll the cotton swab around the G-tube insertion site to remove any drainage or crusting at the insertion site.  RESIDUALS Feeding tube residuals are the amount of liquids that are in the stomach at any given time. Residuals may be checked before giving feedings, medications, or as instructed by your caregiver.  Ask your caregiver if there are instances when you would not start tube feedings depending on the amount or type of contents withdrawn from the stomach.   Check residuals by attaching a syringe to the G-tube and pull back on the syringe plunger. Note the amount and return the residual back into the stomach.  FLUSHING THE G-TUBE  The G-tube should be periodically flushed with clean warm water to keep it from  clogging.   Flush the G-tube after feedings or medications. Draw up 30 mLs of warm water in a syringe. Connect the syringe to the G-tube and slowly push the water into the tube.   Do not push feedings, medications, or flushes rapidly. Flush the G-tube gently and slowly.   Only use syringes made for G-tubes to flush medications or feedings.   Your caregiver may want the G-tube flushed more often or with more water. If this is the case, follow your caregiver's instructions.  FEEDINGS Your caregiver will determine whether feedings are given as a bolus (a certain amount given at one time and at scheduled times) or whether feedings will be given continuously on a feeding pump.   Formulas should be given at room temperature.   If feedings are continuous, no more than 4 hours worth of feedings should be placed in the feeding bag. This helps prevent spoilage or accidental excess infusion.   Cover and place unused formula in the refrigerator.   If feedings are continuous, stop the feedings when medications or flushes are given. Be sure to restart the feedings.   Feeding bags and syringes should be replaced as instructed by your caregiver.  GIVING MEDICATION   In general, it is best if all medications are in a liquid form for G-tube administration. Liquid medications are less likely to clog the G-tube.   Mix the liquid medication with 30 mLs (or amount recommended by your  caregiver) of warm water.   Draw up the medication into the syringe.   Attach the syringe to the G-tube and slowly push the mixture into the G-tube.   After giving the medication, draw up 30 mLs of warm water in the syringe and slowly flush the G-tube.   For pills or capsules, check with your caregiver first before crushing medications. Some pills are not effective if they are crushed. Some capsules are sustained release medications.   If appropriate, crush the pill or capsule and mix with 30 mLs of warm water. Using the  syringe, slowly push the medication through the tube, then flush the tube with another 30 mLs of tap water.  G-TUBE PROBLEMS G-tube was pulled out.  Cause: May have been pulled out accidentally.   Solutions: Cover the opening with clean dressing and tape. Call your caregiver right away. The G-tube should be put in as soon as possible (within 4 hours) so the G-tube opening (tract) does not close. The G-tube needs to be put in at a healthcare setting. An X-ray needs to be done to confirm placement before the G-tube can be used again.  Redness, irritation, soreness, or foul odor around the gastrostomy site.  Cause: May be caused by leakage or infection.   Solutions: Call your caregiver right away.  Large amount of leakage of fluid or mucus-like liquid present (a large amount means it soaks clothing).  Cause: Many reasons could cause the G-tube to leak.   Solutions: Call your caregiver to discuss the amount of leakage.  Skin or scar tissue appears to be growing where tube enters skin.   Cause: Tissue growth may develop around the insertion site if the G-tube is moved or pulled on excessively.   Solutions: Secure tube with tape so that excess movement does not occur. Call your caregiver.  G-tube is clogged.  Cause: Thick formula or medication.   Solutions: Try to slowly push warm water into the tube with a large syringe. Never try to push any object into the tube to unclog it. Do not force fluid into the G-tube. If you are unable to unclog the tube, call your caregiver right away.  TIPS  Head of Bed [facility]) position refers to the upright position of a person's upper body.   When giving medications or a feeding bolus, keep the HOB up as told by your caregiver. Do this during the feeding and for 1 hour after the feeding or medication administration.   If continuous feedings are being given, it is best to keep the [facility] up as told by your caregiver. When ADLs (Activities of Daily Living) are  performed and the [facility] needs to be flat, be sure to turn the feeding pump off. Restart the feeding pump when the [facility] is returned to the recommended height.   Do not pull or put tension on the tube.   To prevent fluid backflow, kink the G-tube before removing the cap or disconnecting a syringe.   Check the G-tube length every day. Measure from the insertion site to the end of the G-tube. If the length is longer than previous measurements, the tube may be coming out. Call your caregiver if you notice increasing G-tube length.   Oral care, such as brushing teeth, must be continued.   You may need to remove excess air (vent) from the G-tube. Your caregiver will tell you if this is needed.   Always call your caregiver if you have questions or problems with the G-tube.  SEEK IMMEDIATE MEDICAL CARE IF:   You have severe abdominal pain, tenderness, or abdominal bloating(distension).   You have nausea or vomiting.   You are constipated or have problems moving your bowels.   The G-tube insertion site is red, swollen, has a foul smell, or has yellow or brown drainage.   You have difficulty breathing or shortness of breath.   You have a fever.   You have a large amount of feeding tube residuals.   The G-tube is clogged and cannot be flushed.  MAKE SURE YOU:   Understand these instructions.   Will watch your condition.   Will get help right away if you are not doing well or get worse.  Document Released: 02/24/2002 Document Revised: 12/05/2011 Document Reviewed: 04/12/2008 [facility] Patient Information 2012 [place], [place].

## 2012-04-18 NOTE — ED Notes (Signed)
Pt from home with reports of abdominal pain around the umbilical area, pt also reports PEG tube placed in February 2013 due to throat cancer with swelling, drainage and pain at site. Pt denies N/V/D or fever.

## 2012-04-20 ENCOUNTER — Ambulatory Visit: Payer: Medicare Other

## 2012-04-22 ENCOUNTER — Ambulatory Visit: Payer: Medicare Other

## 2012-04-24 ENCOUNTER — Ambulatory Visit: Payer: Medicare Other

## 2012-04-27 ENCOUNTER — Ambulatory Visit: Payer: Medicare Other

## 2012-04-28 ENCOUNTER — Ambulatory Visit: Payer: Medicare Other

## 2012-04-29 ENCOUNTER — Ambulatory Visit: Payer: Medicare Other | Attending: Internal Medicine

## 2012-04-29 DIAGNOSIS — IMO0001 Reserved for inherently not codable concepts without codable children: Secondary | ICD-10-CM | POA: Insufficient documentation

## 2012-04-29 DIAGNOSIS — R1313 Dysphagia, pharyngeal phase: Secondary | ICD-10-CM | POA: Insufficient documentation

## 2012-05-04 ENCOUNTER — Ambulatory Visit: Payer: Medicare Other

## 2012-05-06 ENCOUNTER — Ambulatory Visit: Payer: Medicare Other

## 2012-05-07 ENCOUNTER — Ambulatory Visit: Payer: Medicare Other

## 2012-05-11 ENCOUNTER — Ambulatory Visit: Payer: Medicare Other

## 2012-05-13 ENCOUNTER — Ambulatory Visit: Payer: Medicare Other

## 2012-05-15 ENCOUNTER — Ambulatory Visit: Payer: Medicare Other

## 2012-05-18 ENCOUNTER — Ambulatory Visit: Payer: Medicare Other

## 2012-05-20 ENCOUNTER — Ambulatory Visit: Payer: Medicare Other

## 2012-05-22 ENCOUNTER — Ambulatory Visit: Payer: Medicare Other

## 2012-05-27 ENCOUNTER — Ambulatory Visit: Payer: Medicare Other

## 2012-05-29 ENCOUNTER — Ambulatory Visit: Payer: Medicare Other

## 2012-06-01 ENCOUNTER — Ambulatory Visit: Payer: Medicare Other | Attending: Internal Medicine

## 2012-06-01 DIAGNOSIS — R1313 Dysphagia, pharyngeal phase: Secondary | ICD-10-CM | POA: Insufficient documentation

## 2012-06-01 DIAGNOSIS — IMO0001 Reserved for inherently not codable concepts without codable children: Secondary | ICD-10-CM | POA: Insufficient documentation

## 2012-06-03 ENCOUNTER — Ambulatory Visit: Payer: Medicare Other

## 2012-06-05 ENCOUNTER — Ambulatory Visit: Payer: Medicare Other

## 2012-06-10 ENCOUNTER — Ambulatory Visit: Payer: Medicare Other

## 2012-06-11 ENCOUNTER — Ambulatory Visit: Payer: Medicare Other

## 2012-06-12 ENCOUNTER — Ambulatory Visit: Payer: Medicare Other

## 2012-06-15 ENCOUNTER — Ambulatory Visit: Payer: Medicare Other

## 2012-06-16 ENCOUNTER — Ambulatory Visit: Payer: Medicare Other

## 2012-06-17 ENCOUNTER — Ambulatory Visit: Payer: Medicare Other

## 2012-06-22 ENCOUNTER — Ambulatory Visit: Payer: Medicare Other | Admitting: Speech Pathology

## 2012-06-24 ENCOUNTER — Ambulatory Visit: Payer: Medicare Other | Admitting: Speech Pathology

## 2012-06-29 ENCOUNTER — Ambulatory Visit: Payer: Medicare Other | Attending: Internal Medicine

## 2012-06-29 DIAGNOSIS — IMO0001 Reserved for inherently not codable concepts without codable children: Secondary | ICD-10-CM | POA: Insufficient documentation

## 2012-06-29 DIAGNOSIS — R1313 Dysphagia, pharyngeal phase: Secondary | ICD-10-CM | POA: Insufficient documentation

## 2012-07-01 ENCOUNTER — Ambulatory Visit: Payer: Medicare Other

## 2012-07-03 ENCOUNTER — Ambulatory Visit: Payer: Medicare Other

## 2012-07-06 ENCOUNTER — Ambulatory Visit: Payer: Medicare Other

## 2012-07-08 ENCOUNTER — Ambulatory Visit: Payer: Medicare Other

## 2012-07-09 ENCOUNTER — Ambulatory Visit: Payer: Medicare Other

## 2012-07-15 ENCOUNTER — Ambulatory Visit: Payer: Medicare Other

## 2012-07-16 ENCOUNTER — Ambulatory Visit: Payer: Medicare Other

## 2012-07-17 ENCOUNTER — Ambulatory Visit: Payer: Medicare Other

## 2012-07-20 ENCOUNTER — Ambulatory Visit: Payer: Medicare Other

## 2012-07-22 ENCOUNTER — Ambulatory Visit: Payer: Medicare Other

## 2012-07-24 ENCOUNTER — Ambulatory Visit: Payer: Medicare Other

## 2012-07-28 ENCOUNTER — Ambulatory Visit: Payer: Medicare Other

## 2012-08-11 ENCOUNTER — Ambulatory Visit: Payer: Medicare Other | Attending: Internal Medicine

## 2012-08-11 DIAGNOSIS — R1313 Dysphagia, pharyngeal phase: Secondary | ICD-10-CM | POA: Insufficient documentation

## 2012-08-11 DIAGNOSIS — IMO0001 Reserved for inherently not codable concepts without codable children: Secondary | ICD-10-CM | POA: Insufficient documentation

## 2012-08-18 ENCOUNTER — Ambulatory Visit: Payer: Medicare Other | Admitting: *Deleted

## 2012-08-18 ENCOUNTER — Encounter: Payer: Medicare Other | Admitting: *Deleted

## 2012-08-27 ENCOUNTER — Ambulatory Visit: Payer: Medicare Other

## 2012-11-12 ENCOUNTER — Emergency Department (HOSPITAL_COMMUNITY): Payer: Medicare Other

## 2012-11-12 ENCOUNTER — Encounter (HOSPITAL_COMMUNITY): Payer: Self-pay | Admitting: Emergency Medicine

## 2012-11-12 ENCOUNTER — Emergency Department (HOSPITAL_COMMUNITY)
Admission: EM | Admit: 2012-11-12 | Discharge: 2012-11-12 | Disposition: A | Discharge status: Home / Self Care | Payer: Medicare Other | Attending: Emergency Medicine | Admitting: Emergency Medicine

## 2012-11-12 DIAGNOSIS — R109 Unspecified abdominal pain: Secondary | ICD-10-CM

## 2012-11-12 DIAGNOSIS — Z859 Personal history of malignant neoplasm, unspecified: Secondary | ICD-10-CM | POA: Insufficient documentation

## 2012-11-12 DIAGNOSIS — Z79899 Other long term (current) drug therapy: Secondary | ICD-10-CM | POA: Insufficient documentation

## 2012-11-12 DIAGNOSIS — Z923 Personal history of irradiation: Secondary | ICD-10-CM | POA: Insufficient documentation

## 2012-11-12 DIAGNOSIS — R1032 Left lower quadrant pain: Secondary | ICD-10-CM | POA: Insufficient documentation

## 2012-11-12 DIAGNOSIS — Z87891 Personal history of nicotine dependence: Secondary | ICD-10-CM | POA: Insufficient documentation

## 2012-11-12 LAB — URINE MICROSCOPIC-ADD ON

## 2012-11-12 LAB — URINALYSIS, ROUTINE W REFLEX MICROSCOPIC
Bilirubin Urine: NEGATIVE
Glucose, UA: NEGATIVE mg/dL
Ketones, ur: NEGATIVE mg/dL
Leukocytes, UA: NEGATIVE
Nitrite: NEGATIVE
Protein, ur: NEGATIVE mg/dL
Specific Gravity, Urine: 1.01 (ref 1.005–1.030)
Urobilinogen, UA: 1 mg/dL (ref 0.0–1.0)
pH: 7 (ref 5.0–8.0)

## 2012-11-12 LAB — BASIC METABOLIC PANEL
BUN: 13 mg/dL (ref 6–23)
CO2: 27 mEq/L (ref 19–32)
Calcium: 9.5 mg/dL (ref 8.4–10.5)
Chloride: 99 mEq/L (ref 96–112)
Creatinine, Ser: 0.78 mg/dL (ref 0.50–1.35)
GFR calc Af Amer: >90 mL/min (ref >90)
GFR calc non Af Amer: >90 mL/min (ref >90)
Glucose, Bld: 76 mg/dL (ref 70–99)
Potassium: 3.5 mEq/L (ref 3.5–5.1)
Sodium: 136 mEq/L (ref 135–145)

## 2012-11-12 LAB — CBC
HCT: 39.7 % (ref 39.0–52.0)
Hemoglobin: 13.3 g/dL (ref 13.0–17.0)
MCH: 31.4 pg (ref 26.0–34.0)
MCHC: 33.5 g/dL (ref 30.0–36.0)
MCV: 93.6 fL (ref 78.0–100.0)
Platelets: 320 10*3/uL (ref 150–400)
RBC: 4.24 MIL/uL (ref 4.22–5.81)
RDW: 12.8 % (ref 11.5–15.5)
WBC: 6.3 10*3/uL (ref 4.0–10.5)

## 2012-11-12 MED ORDER — HYDROMORPHONE HCL PF 1 MG/ML IJ SOLN
1.0000 mg | Freq: Once | INTRAMUSCULAR | Status: AC
  Administered 2012-11-12: 1 mg via INTRAVENOUS
  Filled 2012-11-12: qty 1

## 2012-11-12 MED ORDER — SODIUM CHLORIDE 0.9 % IV BOLUS (SEPSIS)
1000.0000 mL | Freq: Once | INTRAVENOUS | Status: AC
  Administered 2012-11-12: 1000 mL via INTRAVENOUS

## 2012-11-12 MED ORDER — OXYCODONE HCL 5 MG PO TABS: 5.0000 mg | ORAL_TABLET | ORAL | Status: DC | PRN

## 2012-11-12 MED ORDER — ONDANSETRON HCL 4 MG/2ML IJ SOLN
4.0000 mg | Freq: Once | INTRAMUSCULAR | Status: AC
  Administered 2012-11-12: 4 mg via INTRAVENOUS
  Filled 2012-11-12: qty 2

## 2012-11-12 MED ORDER — IOHEXOL 300 MG/ML  SOLN
100.0000 mL | Freq: Once | INTRAMUSCULAR | Status: AC | PRN
  Administered 2012-11-12: 100 mL via INTRAVENOUS

## 2012-11-12 NOTE — ED Notes (Signed)
Patient still unable to provide urine sample

## 2012-11-12 NOTE — ED Provider Notes (Signed)
History    61yM with abdominal pain. LLQ. Gradual onset about 3d ago. Constant w/o appreciable exacerbating or relieving factors. No n/v. No fever or chills. Feels like urinating less often, otherwise no urinary complaints. No CP or SOB. No diarrhea. Hx of previous abdominal surgeries and g-tube which was subsequently removed a few months ago and has been doing relatively well until onset of these recent symptoms.  CSN: [id number]  Arrival date & time 11/12/12  0940   First MD Initiated Contact with Patient 11/12/12 [phone]      Chief Complaint  Patient presents with  . Flank Pain    (Consider location/radiation/quality/duration/timing/severity/associated sxs/prior treatment) HPI  Past Medical History  Diagnosis Date  . Cancer   . Pain around PEG tube site   . Radiation     Past Surgical History  Procedure Date  . Gastrostomy tube placement     History reviewed. No pertinent family history.  History  Substance Use Topics  . Smoking status: Former Smoker    Quit date: 04/18/1998  . Smokeless tobacco: Never Used  . Alcohol Use: No      Review of Systems   Review of symptoms negative unless otherwise noted in HPI.   Allergies  Review of patient's allergies indicates no known allergies.  Home Medications   Current Outpatient Rx  Name  Route  Sig  Dispense  Refill  . HYDROCODONE-ACETAMINOPHEN 5-500 MG PO TABS   Oral   Take 1 tablet by mouth every 6 (six) hours as needed. Pain         . OSMOLITE 1.2 CAL PO LIQD   Per Tube   Place 237 mLs into feeding tube 4 (four) times daily.         Marland Kitchen PROMOD PO LIQD   PEG Tube   45 mLs by PEG Tube route 2 (two) times daily.         . TRAMADOL HCL 50 MG PO TABS   Oral   Take 50 mg by mouth every 6 (six) hours as needed. Pain           BP 131/77  Pulse 70  Resp 18  SpO2 99%  Physical Exam  Nursing note and vitals reviewed. Constitutional: He appears well-developed and well-nourished. No distress.  HENT:    Head: Normocephalic and atraumatic.  Eyes: Conjunctivae normal are normal. Right eye exhibits no discharge. Left eye exhibits no discharge.  Neck: Neck supple.  Cardiovascular: Normal rate, regular rhythm and normal heart sounds.  Exam reveals no gallop and no friction rub.   No murmur heard. Pulmonary/Chest: Effort normal and breath sounds normal. No respiratory distress.  Abdominal: Soft. He exhibits no distension and no mass. There is tenderness. There is no rebound and no guarding.       LLW tenderness w/o rebound or guarding. No distension. Well healed surgical scars.  Musculoskeletal: He exhibits no edema and no tenderness.  Neurological: He is alert.  Skin: Skin is warm and dry.  Psychiatric: He has a normal mood and affect. His behavior is normal. Thought content normal.    ED Course  Procedures (including critical care time)  Labs Reviewed  URINALYSIS, ROUTINE W REFLEX MICROSCOPIC - Abnormal; Notable for the following:    Hgb urine dipstick TRACE (*)     All other components within normal limits  BASIC METABOLIC PANEL  CBC  URINE MICROSCOPIC-ADD ON  LAB REPORT - SCANNED   No results found.  Ct Abdomen Pelvis W Contrast  11/12/2012  *  RADIOLOGY REPORT*  Clinical Data: Left lower quadrant abdominal pain and flank pain  CT ABDOMEN AND PELVIS WITH CONTRAST  Technique:  Multidetector CT imaging of the abdomen and pelvis was performed following the standard protocol during bolus administration of intravenous contrast.  Contrast: OMNIPAQUE IOHEXOL 300 MG/ML  SOLN  Comparison: 04/16/2012, 02/27/2012  Findings: Mild basilar scarring and emphysema noted.  Minimal dependent basilar atelectasis.  No lower lobe pneumonia evident. Normal heart size.  No pericardial pleural effusion.  Negative hiatal hernia.  Abdomen:  Stable scattered 5 mm small hypodensities throughout the liver, suspect small hepatic cysts but too small to definitively characterize.  No biliary dilatation.  Slight  periportal edema noted.  Hepatic and portal veins are patent.  Gallbladder, biliary system, pancreas, spleen, and adrenal glands are within normal limits for age and demonstrate no acute finding.  Kidneys demonstrate normal enhancement and excretion.  Stable small renal cysts bilaterally, largest cyst in the left kidney mid pole measures 14 mm.  No obstructing urinary tract calculus or hydronephrosis.  Negative for bowel obstruction, dilatation, ileus, or free air. Interval removal of the gastrostomy.  Atherosclerosis of the aorta without aneurysm or dissection.  No abdominal free fluid, fluid collection, hemorrhage, adenopathy, or abscess  Pelvis:  No pelvic free fluid, fluid collection, hemorrhage, adenopathy, inguinal abnormality, or hernia.  Urinary bladder unremarkable.  No acute distal bowel process.  Prostate gland is enlarged.  Stable scattered pelvic sclerotic bone islands.  Degenerative changes of the lower lumbar spine.  Bilateral SI joint arthropathy noted.  IMPRESSION: Interval gastrostomy removal.  Stable small hepatic and renal cysts  Nonspecific periportal edema within the liver but no biliary obstruction or focal abnormality  Atherosclerosis of the aorta  No acute intra-abdominal or pelvic process.  Overall stable exam.   Original Report Authenticated By: Judie Petit. Shick, M.D.     1. Abdominal pain       MDM  61yM with abdominal pain. Etiology unclear, but does not appear to be emergent process. W/u reassuring including normal labs aside from trace hematuria. CT without evidence of acute emergent process. Feel safe for DC. Return precautions dicussed.       Raeford Razor, MD 11/14/12 2019

## 2012-11-12 NOTE — ED Notes (Signed)
Urinal provided for patient. Patient unable to provide urine sample at this time.

## 2012-11-12 NOTE — ED Notes (Signed)
Pt c/o left flank pain x 3 days with difficulty urinating.

## 2012-11-12 NOTE — ED Notes (Signed)
IV team paged.  

## 2012-11-12 NOTE — ED Notes (Signed)
Patient given discharge instructions, information, prescriptions, and diet order. Patient states that they adequately understand discharge information given and to return to ED if symptoms return or worsen.     

## 2012-12-26 ENCOUNTER — Emergency Department (HOSPITAL_COMMUNITY)
Admission: EM | Admit: 2012-12-26 | Discharge: 2012-12-26 | Disposition: A | Discharge status: Home / Self Care | Payer: Medicare Other | Attending: Emergency Medicine | Admitting: Emergency Medicine

## 2012-12-26 ENCOUNTER — Other Ambulatory Visit: Payer: Self-pay

## 2012-12-26 ENCOUNTER — Emergency Department (HOSPITAL_COMMUNITY): Payer: Medicare Other

## 2012-12-26 ENCOUNTER — Encounter (HOSPITAL_COMMUNITY): Payer: Self-pay | Admitting: Emergency Medicine

## 2012-12-26 DIAGNOSIS — R1012 Left upper quadrant pain: Secondary | ICD-10-CM | POA: Insufficient documentation

## 2012-12-26 DIAGNOSIS — Z8501 Personal history of malignant neoplasm of esophagus: Secondary | ICD-10-CM | POA: Insufficient documentation

## 2012-12-26 DIAGNOSIS — Z87891 Personal history of nicotine dependence: Secondary | ICD-10-CM | POA: Insufficient documentation

## 2012-12-26 LAB — TROPONIN I: Troponin I: <0.3 ng/mL (ref <0.30)

## 2012-12-26 LAB — URINALYSIS, ROUTINE W REFLEX MICROSCOPIC
Bilirubin Urine: NEGATIVE
Glucose, UA: NEGATIVE mg/dL
Specific Gravity, Urine: 1.025 (ref 1.005–1.030)
pH: 7 (ref 5.0–8.0)

## 2012-12-26 LAB — CBC WITH DIFFERENTIAL/PLATELET
Basophils Absolute: 0 10*3/uL (ref 0.0–0.1)
Basophils Relative: 0 % (ref 0–1)
Eosinophils Absolute: 0 10*3/uL (ref 0.0–0.7)
Eosinophils Relative: 1 % (ref 0–5)
Lymphocytes Relative: 18 % (ref 12–46)
MCHC: 33.7 g/dL (ref 30.0–36.0)
MCV: 91.9 fL (ref 78.0–100.0)
Monocytes Absolute: 0.3 10*3/uL (ref 0.1–1.0)
Platelets: 240 10*3/uL (ref 150–400)
RDW: 12.5 % (ref 11.5–15.5)
WBC: 5.5 10*3/uL (ref 4.0–10.5)

## 2012-12-26 LAB — URINE MICROSCOPIC-ADD ON

## 2012-12-26 LAB — COMPREHENSIVE METABOLIC PANEL
ALT: 14 U/L (ref 0–53)
AST: 24 U/L (ref 0–37)
Albumin: 4 g/dL (ref 3.5–5.2)
CO2: 27 mEq/L (ref 19–32)
Calcium: 9.3 mg/dL (ref 8.4–10.5)
Creatinine, Ser: 0.81 mg/dL (ref 0.50–1.35)
Sodium: 135 mEq/L (ref 135–145)
Total Protein: 7.7 g/dL (ref 6.0–8.3)

## 2012-12-26 MED ORDER — DICYCLOMINE HCL 20 MG PO TABS: 20.0000 mg | ORAL_TABLET | Freq: Two times a day (BID) | ORAL | Status: DC

## 2012-12-26 MED ORDER — OXYCODONE-ACETAMINOPHEN 5-325 MG PO TABS: 2.0000 | ORAL_TABLET | Freq: Four times a day (QID) | ORAL | Status: DC | PRN

## 2012-12-26 MED ORDER — CYCLOBENZAPRINE HCL 10 MG PO TABS: 10.0000 mg | ORAL_TABLET | Freq: Two times a day (BID) | ORAL | Status: DC | PRN

## 2012-12-26 MED ORDER — HYDROMORPHONE HCL PF 1 MG/ML IJ SOLN
1.0000 mg | Freq: Once | INTRAMUSCULAR | Status: AC
  Administered 2012-12-26: 1 mg via INTRAVENOUS
  Filled 2012-12-26: qty 1

## 2012-12-26 NOTE — ED Notes (Signed)
[id number]<AV> Expected date:12/26/12<BR> Expected time: 1:40 PM<BR> Means of arrival:Ambulance<BR> Comments:<BR> Upper abd pain, Ca Pt

## 2012-12-26 NOTE — ED Provider Notes (Signed)
History     CSN: [id number]  Arrival date & time 12/26/12  1337   First MD Initiated Contact with Patient 12/26/12 1411      Chief Complaint  Patient presents with  . Abdominal Pain    (Consider location/radiation/quality/duration/timing/severity/associated sxs/prior treatment) HPI Comments: Patient with history of esophageal cancer s/p radiation in 2000, presents with sudden onset of LUQ pain. Pain is rated 10/10 with radiation to the back. Pain is better with forward flexion. Of note patient was seen in this ED on 11/12/12 for same complaint. CT abdomen pelvis done at that time unremarkable. Patient does not have cardiac history. Family history significant for sister with MI at 37. Denies fever or chills. Denies NVD. Denies cough or congestion.  The history is provided by the patient. No language interpreter was used.    Past Medical History  Diagnosis Date  . Cancer   . Pain around PEG tube site   . Radiation     Past Surgical History  Procedure Date  . Gastrostomy tube placement     No family history on file.  History  Substance Use Topics  . Smoking status: Former Smoker    Quit date: 04/18/1998  . Smokeless tobacco: Never Used  . Alcohol Use: No      Review of Systems  Constitutional: Negative for chills and fatigue.  Respiratory: Negative for cough.   Gastrointestinal: Positive for abdominal pain. Negative for nausea, vomiting and diarrhea.    Allergies  Review of patient's allergies indicates no known allergies.  Home Medications  No current outpatient prescriptions on file.  BP 148/82  Pulse 98  Temp 98.6 F (37 C) (Oral)  Resp 20  SpO2 97%  Physical Exam  Nursing note and vitals reviewed. Constitutional: He appears well-developed and well-nourished.  HENT:  Head: Normocephalic and atraumatic.  Mouth/Throat: Oropharynx is clear and moist.  Eyes: Conjunctivae normal and EOM are normal. No scleral icterus.  Neck: Normal range of motion.  Neck supple.  Cardiovascular: Normal rate, regular rhythm and normal heart sounds.   Pulmonary/Chest: Effort normal and breath sounds normal.  Abdominal: Soft. Bowel sounds are normal. He exhibits no distension. There is tenderness. There is no rebound.       Tenderness to palpation LUQ and left rib.  Neurological: He is alert.  Skin: Skin is warm and dry.    ED Course  Procedures (including critical care time)  Date: 12/26/2012  Rate: 89  Rhythm: normal sinus rhythm  QRS Axis: normal  Intervals: normal  ST/T Wave abnormalities: normal  Conduction Disutrbances:none  Narrative Interpretation: No STEMI  Old EKG Reviewed: unchanged   Labs Reviewed  URINALYSIS, ROUTINE W REFLEX MICROSCOPIC - Abnormal; Notable for the following:    Hgb urine dipstick TRACE (*)     All other components within normal limits  CBC WITH DIFFERENTIAL  COMPREHENSIVE METABOLIC PANEL  URINE MICROSCOPIC-ADD ON  TROPONIN I   Results for orders placed during the hospital encounter of 12/26/12  CBC WITH DIFFERENTIAL      Component Value Range   WBC 5.5  4.0 - 10.5 K/uL   RBC 4.46  4.22 - 5.81 MIL/uL   Hemoglobin 13.8  13.0 - 17.0 g/dL   HCT 11.9  14.7 - 82.9 %   MCV 91.9  78.0 - 100.0 fL   MCH 30.9  26.0 - 34.0 pg   MCHC 33.7  30.0 - 36.0 g/dL   RDW 56.2  13.0 - 86.5 %   Platelets 240  150 - 400 K/uL   Neutrophils Relative 76  43 - 77 %   Neutro Abs 4.1  1.7 - 7.7 K/uL   Lymphocytes Relative 18  12 - 46 %   Lymphs Abs 1.0  0.7 - 4.0 K/uL   Monocytes Relative 6  3 - 12 %   Monocytes Absolute 0.3  0.1 - 1.0 K/uL   Eosinophils Relative 1  0 - 5 %   Eosinophils Absolute 0.0  0.0 - 0.7 K/uL   Basophils Relative 0  0 - 1 %   Basophils Absolute 0.0  0.0 - 0.1 K/uL  COMPREHENSIVE METABOLIC PANEL      Component Value Range   Sodium 135  135 - 145 mEq/L   Potassium 4.3  3.5 - 5.1 mEq/L   Chloride 98  96 - 112 mEq/L   CO2 27  19 - 32 mEq/L   Glucose, Bld 78  70 - 99 mg/dL   BUN 16  6 - 23 mg/dL    Creatinine, Ser 5.28  0.50 - 1.35 mg/dL   Calcium 9.3  8.4 - 41.3 mg/dL   Total Protein 7.7  6.0 - 8.3 g/dL   Albumin 4.0  3.5 - 5.2 g/dL   AST 24  0 - 37 U/L   ALT 14  0 - 53 U/L   Alkaline Phosphatase 59  39 - 117 U/L   Total Bilirubin 0.4  0.3 - 1.2 mg/dL   GFR calc non Af Amer >90  >90 mL/min   GFR calc Af Amer >90  >90 mL/min  URINALYSIS, ROUTINE W REFLEX MICROSCOPIC      Component Value Range   Color, Urine YELLOW  YELLOW   APPearance CLEAR  CLEAR   Specific Gravity, Urine 1.025  1.005 - 1.030   pH 7.0  5.0 - 8.0   Glucose, UA NEGATIVE  NEGATIVE mg/dL   Hgb urine dipstick TRACE (*) NEGATIVE   Bilirubin Urine NEGATIVE  NEGATIVE   Ketones, ur NEGATIVE  NEGATIVE mg/dL   Protein, ur NEGATIVE  NEGATIVE mg/dL   Urobilinogen, UA 1.0  0.0 - 1.0 mg/dL   Nitrite NEGATIVE  NEGATIVE   Leukocytes, UA NEGATIVE  NEGATIVE  URINE MICROSCOPIC-ADD ON      Component Value Range   Squamous Epithelial / LPF RARE  RARE   WBC, UA 0-2  <3 WBC/hpf   RBC / HPF 3-6  <3 RBC/hpf   Bacteria, UA RARE  RARE   Urine-Other SPERM PRESENT    TROPONIN I      Component Value Range   Troponin I <0.30  <0.30 ng/mL    Dg Chest 2 View  12/26/2012  *RADIOLOGY REPORT*  Clinical Data: Left upper abdominal pain radiates to back.  CHEST - 2 VIEW  Comparison: 07/23/2011  Findings: Hyperexpansion is consistent with emphysema. The lungs are clear without focal infiltrate, edema, pneumothorax or pleural effusion. The cardiopericardial silhouette is within normal limits for size.  Thoracolumbar scoliosis again noted. Telemetry leads overlie the chest.  IMPRESSION: Hyperinflation without acute cardiopulmonary findings.   Original Report Authenticated By: Kennith Center, M.D.      1. LUQ pain       MDM  Patient presented with complaint of LLQ pain. Seen in November with same complaint and negative imaging. Labs and EKG unremarkable. CXR: unremarkable. Seen by Dr. Denton Lank who agrees that patient can go home. Etiology of  pain unclear. Discharged with Rx for Percocet, flexeril, and Bentyl, and instructions to follow-up with Oncology about  possible g-tube replacement. Return precautions given.        Pixie Casino, PA-C 12/26/12 1627

## 2012-12-26 NOTE — ED Notes (Signed)
Pt was walking outside when he had a sudden onset of upper left abdominal pain that radiates to back. Pt crawled back to house, comfortable in fetal position. Medical hx throat ca 1999 (radiation in 2000). NKA. Initial VS 128/88 Pulse 94 RR 20 O2 98% RA at 1330.

## 2012-12-26 NOTE — ED Notes (Addendum)
Pt transported to XR.  

## 2012-12-29 NOTE — ED Provider Notes (Signed)
Medical screening examination/treatment/procedure(s) were conducted as a shared visit with non-physician practitioner(s) and myself.  I personally evaluated the patient during the encounter Pt c/o recurrent luq pain, prior evals/imaging for same. No fever or chills. No nv. Labs. abd soft nt.   Suzi Roots, MD 12/29/12 [phone]

## 2013-02-16 ENCOUNTER — Other Ambulatory Visit: Payer: Self-pay | Admitting: Internal Medicine

## 2013-02-16 ENCOUNTER — Ambulatory Visit
Admission: RE | Admit: 2013-02-16 | Discharge: 2013-02-16 | Disposition: A | Discharge status: Home / Self Care | Payer: Medicare Other | Source: Ambulatory Visit | Attending: Internal Medicine | Admitting: Internal Medicine

## 2013-02-16 DIAGNOSIS — C760 Malignant neoplasm of head, face and neck: Secondary | ICD-10-CM

## 2013-02-16 MED ORDER — IOHEXOL 300 MG/ML  SOLN
75.0000 mL | Freq: Once | INTRAMUSCULAR | Status: AC | PRN
  Administered 2013-02-16: 75 mL via INTRAVENOUS

## 2013-02-18 ENCOUNTER — Other Ambulatory Visit: Payer: Self-pay | Admitting: Internal Medicine

## 2013-02-18 DIAGNOSIS — I779 Disorder of arteries and arterioles, unspecified: Secondary | ICD-10-CM

## 2013-02-22 ENCOUNTER — Ambulatory Visit
Admission: RE | Admit: 2013-02-22 | Discharge: 2013-02-22 | Disposition: A | Discharge status: Home / Self Care | Payer: Medicare Other | Source: Ambulatory Visit | Attending: Internal Medicine | Admitting: Internal Medicine

## 2013-02-22 DIAGNOSIS — I779 Disorder of arteries and arterioles, unspecified: Secondary | ICD-10-CM

## 2013-02-23 ENCOUNTER — Other Ambulatory Visit: Payer: Self-pay | Admitting: *Deleted

## 2013-02-24 ENCOUNTER — Encounter: Payer: Self-pay | Admitting: Vascular Surgery

## 2013-02-25 ENCOUNTER — Other Ambulatory Visit (INDEPENDENT_AMBULATORY_CARE_PROVIDER_SITE_OTHER): Payer: Medicare Other | Admitting: *Deleted

## 2013-02-25 ENCOUNTER — Encounter: Payer: Self-pay | Admitting: *Deleted

## 2013-02-25 ENCOUNTER — Ambulatory Visit (INDEPENDENT_AMBULATORY_CARE_PROVIDER_SITE_OTHER): Payer: Medicare Other | Admitting: Vascular Surgery

## 2013-02-25 ENCOUNTER — Encounter: Payer: Self-pay | Admitting: Vascular Surgery

## 2013-02-25 DIAGNOSIS — I6529 Occlusion and stenosis of unspecified carotid artery: Secondary | ICD-10-CM

## 2013-02-25 NOTE — Progress Notes (Signed)
History of Present Illness:  Patient is a 62 y.o. year old male who presents for evaluation of carotid stenosis.  The patient denies symptoms of TIA, amaurosis, or stroke.  The patient is currently on no antiplatelet therapy.  The carotid stenosis was found CT Angio for evaluation of left neck mass. The patient has a prior history of head and neck cancer and radiation therapy. This was in 1999. He is currently followed by Dr. Manson Passey with ENT at [facility]. He is a former smoker but quit in 1998. He has had problems with dysphagia over the last year. He previously had a feeding tube but this has now been removed. His family still states that he has been losing weight. He is able the and swallow some things.    Past Medical History  Diagnosis Date  . Pain around PEG tube site   . Radiation   . Cancer     Past Surgical History  Procedure Laterality Date  . Gastrostomy tube placement       Social History History  Substance Use Topics  . Smoking status: Former Smoker    Quit date: 04/18/1998  . Smokeless tobacco: Never Used  . 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 Heart disease Sister     before age 22  . Clotting disorder Sister   . Cancer Brother   . Heart disease Brother   . Heart attack Brother     Allergies  No Known Allergies   Current Outpatient Prescriptions  Medication Sig Dispense Refill  . cyclobenzaprine (FLEXERIL) 10 MG tablet Take 1 tablet (10 mg total) by mouth 2 (two) times daily as needed for muscle spasms.  20 tablet  0  . dicyclomine (BENTYL) 20 MG tablet Take 1 tablet (20 mg total) by mouth 2 (two) times daily.  20 tablet  0  . oxyCODONE-acetaminophen (PERCOCET/ROXICET) 5-325 MG per tablet Take 2 tablets by mouth every 6 (six) hours as needed for pain.  20 tablet  0   No current  facility-administered medications for this visit.    ROS:   General:  + weight loss, no Fever, chills  HEENT: No recent headaches, no nasal bleeding, no visual changes, +sore throat  Neurologic: No dizziness, blackouts, seizures. No recent symptoms of stroke or mini- stroke. No recent episodes of slurred speech, or temporary blindness.  Cardiac: No recent episodes of chest pain/pressure, no shortness of breath at rest.  No shortness of breath with exertion.  Denies history of atrial fibrillation or irregular heartbeat  Vascular: No history of rest pain in feet.  No history of claudication.  No history of non-healing ulcer, No history of DVT   Pulmonary: No home oxygen, no productive cough, no hemoptysis,  No asthma or wheezing  Musculoskeletal:  [ ]  Arthritis, [ ]  Low back pain,  [ ]  Joint pain  Hematologic:No history of hypercoagulable state.  No history of easy bleeding.  No history of anemia  Gastrointestinal: No hematochezia or melena,  No gastroesophageal reflux, no trouble swallowing  Urinary: [ ]  chronic Kidney disease, [ ]  on HD - [ ]  MWF or [ ]  TTHS, [ ]  Burning with urination, [ ]  Frequent urination, [ ]  Difficulty urinating;   Skin: No rashes  Psychological: No history  of anxiety,  No history of depression   Physical Examination  Filed Vitals:   02/25/13 1506 02/25/13 1515  BP: 126/93 121/82  Pulse: 72   Height: 5' 7.5" (1.715 m)   Weight: 114 lb (51.71 kg)     Body mass index is 17.58 kg/(m^2).  General:  Alert and oriented, no acute distress, thin in appearance HEENT: Normal Neck: No bruit or JVD, easily palpable carotid artery at a location that the patient states he felt a mass, mild radiation skin changes especially on the left with some thickening Pulmonary: Clear to auscultation bilaterally Cardiac: Regular Rate and Rhythm without murmur Gastrointestinal: Soft, non-tender, non-distended, no mass, well-healed G-tube scar Skin: No rash Extremity  Pulses:  2+ radial, brachial, femoral pulses bilaterally Musculoskeletal: No deformity or edema  Neurologic: Upper and lower extremity motor 5/5 and symmetric, muffled speech more likely due to pharyngeal weakness from radiation rather than neuro deficit  DATA: I reviewed the patient's CT Angio which showed moderate to severe right internal carotid artery stenosis and moderate left internal carotid artery stenosis. The patient had a repeat carotid duplex exam today which I reviewed and interpreted. 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 This is most likely atherosclerotic in nature but certainly could have radiation contribution.  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 Manson Passey in ENT at [facility]. The patient will be scheduled for carotid angiogram March 21. He will be started on antiplatelet therapy in the form of aspirin.   Fabienne Bruns, MD [facility] [place] Office: [phone] Pager: [phone]

## 2013-02-26 ENCOUNTER — Other Ambulatory Visit: Payer: Self-pay | Admitting: *Deleted

## 2013-03-01 ENCOUNTER — Encounter (HOSPITAL_COMMUNITY): Payer: Self-pay | Admitting: Pharmacy Technician

## 2013-03-18 MED ORDER — SODIUM CHLORIDE 0.9 % IV SOLN: INTRAVENOUS | Status: DC

## 2013-03-19 ENCOUNTER — Encounter (HOSPITAL_COMMUNITY): Admission: RE | Payer: Self-pay | Source: Ambulatory Visit

## 2013-03-19 ENCOUNTER — Ambulatory Visit (HOSPITAL_COMMUNITY): Admission: RE | Admit: 2013-03-19 | Payer: Medicare Other | Source: Ambulatory Visit | Admitting: Vascular Surgery

## 2013-03-19 SURGERY — CAROTID ANGIOGRAM
Anesthesia: LOCAL

## 2013-08-12 ENCOUNTER — Telehealth: Payer: Self-pay | Admitting: *Deleted

## 2013-08-12 NOTE — Telephone Encounter (Signed)
Mrs Neto said he just didn't want to talk to her about resetting up the procedure (carotid angio) with Dr Darrick Penna. She would like to bring him in for another office visit. I will have this set up.

## 2013-09-08 ENCOUNTER — Encounter: Payer: Self-pay | Admitting: Vascular Surgery

## 2013-09-09 ENCOUNTER — Ambulatory Visit (INDEPENDENT_AMBULATORY_CARE_PROVIDER_SITE_OTHER): Payer: Medicare Other | Admitting: Vascular Surgery

## 2013-09-09 ENCOUNTER — Encounter: Payer: Self-pay | Admitting: Vascular Surgery

## 2013-09-09 DIAGNOSIS — I6529 Occlusion and stenosis of unspecified carotid artery: Secondary | ICD-10-CM

## 2013-09-09 NOTE — Progress Notes (Signed)
History of Present Illness:  Patient is a 62 y.o. year old male who presents for evaluation of carotid stenosis.  The patient denies symptoms of TIA, amaurosis, or stroke.  The patient is on aspirin antiplatelet therapy.  The carotid stenosis was found CT Angio for evaluation of left neck mass several months ago. The patient has a prior history of head and neck cancer and radiation therapy. This was in 1999. He is currently followed by Dr. Manson Passey with ENT at [facility]. He is a former smoker but quit in 1998. He has had problems with dysphagia over the last year. He previously had a feeding tube but this has now been removed.  He is able the and swallow some things.  he was previously offered carotid angiogram to study a severe right internal carotid artery stenosis but decided not to go through the procedure.    Past Medical History   Diagnosis  Date   .  Pain around PEG tube site     .  Radiation     .  Cancer         Past Surgical History   Procedure  Laterality  Date   .  Gastrostomy tube placement          Social History History   Substance Use Topics   .  Smoking status:  Former Smoker       Quit date:  04/18/1998   .  Smokeless tobacco:  Never Used   .  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  Heart disease  Sister         before age 51   .  Clotting disorder  Sister     .  Cancer  Brother     .  Heart disease  Brother     .  Heart attack  Brother       Allergies  No Known Allergies     Current Outpatient Prescriptions   Medication  Sig  Dispense  Refill   .  cyclobenzaprine (FLEXERIL) 10 MG tablet  Take 1 tablet (10 mg total) by mouth 2 (two) times daily as needed for muscle spasms.   20 tablet   0   .  dicyclomine (BENTYL) 20 MG tablet  Take 1 tablet (20 mg total) by mouth 2 (two)  times daily.   20 tablet   0   .  oxyCODONE-acetaminophen (PERCOCET/ROXICET) 5-325 MG per tablet  Take 2 tablets by mouth every 6 (six) hours as needed for pain.   20 tablet   0      No current facility-administered medications for this visit.     ROS:    General:   weight loss, no Fever, chills  HEENT: No recent headaches, no nasal bleeding, no visual changes, +sore throat  Neurologic: No dizziness, blackouts, seizures. No recent symptoms of stroke or mini- stroke. No recent episodes of slurred speech, or temporary blindness.  Cardiac: No recent episodes of chest pain/pressure, no shortness of breath at rest.  No shortness of breath with exertion.  Denies history of atrial fibrillation or irregular heartbeat  Vascular: No history of rest pain in feet.  No history of claudication.  No  history of non-healing ulcer, No history of DVT    Pulmonary: No home oxygen, no productive cough, no hemoptysis,  No asthma or wheezing  Musculoskeletal:  [ ]  Arthritis, [ ]  Low back pain,  [ ]  Joint pain  Hematologic:No history of hypercoagulable state.  No history of easy bleeding.  No history of anemia  Gastrointestinal: No hematochezia or melena,  No gastroesophageal reflux, no trouble swallowing  Urinary: [ ]  chronic Kidney disease, [ ]  on HD - [ ]  MWF or [ ]  TTHS, [ ]  Burning with urination, [ ]  Frequent urination, [ ]  Difficulty urinating;    Skin: No rashes  Psychological: No history of anxiety,  No history of depression   Physical Examination     Filed Vitals:   09/09/13 1310 09/09/13 1313  BP: 146/93 146/93  Pulse: 84 104  Resp: 16   Height: 5\' 5"  (1.651 m)   Weight: 122 lb (55.339 kg)    General:  Alert and oriented, no acute distress, thin in appearance HEENT: Normal Neck: Right carotid bruit, no left carotid bruit Pulmonary: Clear to auscultation bilaterally Cardiac: Regular Rate and Rhythm without murmur Gastrointestinal: Soft, non-tender, non-distended, no mass Skin: No  rash Extremity Pulses:  2+ radial, brachial, femoral pulses bilaterally Musculoskeletal: No deformity or edema     Neurologic: Upper and lower extremity motor 5/5 and symmetric, muffled speech more likely due to pharyngeal weakness from radiation rather than neuro deficit   ASSESSMENT: High-grade right internal carotid artery stenosis asymptomatic. This is most likely atherosclerotic in nature but certainly could have radiation contribution.    PLAN:  The patient is still reluctant to proceed with carotid angiogram. He was informed of the risk of stroke of approximately 5% per year. He will call back if he wishes to proceed with carotid angiography with consideration for stenting or carotid endarterectomy. Otherwise he will followup with repeat duplex scan in 6 months and continue his aspirin.Fabienne Bruns, MD [facility] [place] Office: [phone] Pager: [phone]

## 2013-09-09 NOTE — Addendum Note (Signed)
Addended by: Adria Dill L on: 09/09/2013 03:31 PM   Modules accepted: Orders

## 2014-01-31 ENCOUNTER — Other Ambulatory Visit: Payer: Self-pay | Admitting: Gastroenterology

## 2014-02-03 ENCOUNTER — Other Ambulatory Visit: Payer: Self-pay | Admitting: Vascular Surgery

## 2014-02-03 DIAGNOSIS — I6529 Occlusion and stenosis of unspecified carotid artery: Secondary | ICD-10-CM

## 2014-02-21 ENCOUNTER — Encounter (HOSPITAL_COMMUNITY): Payer: Self-pay | Admitting: Pharmacy Technician

## 2014-02-21 ENCOUNTER — Encounter (HOSPITAL_COMMUNITY): Payer: Self-pay | Admitting: *Deleted

## 2014-02-23 ENCOUNTER — Encounter (HOSPITAL_COMMUNITY): Payer: Self-pay | Admitting: *Deleted

## 2014-03-15 ENCOUNTER — Ambulatory Visit (HOSPITAL_COMMUNITY): Admission: RE | Admit: 2014-03-15 | Payer: Medicare Other | Source: Ambulatory Visit | Admitting: Gastroenterology

## 2014-03-15 ENCOUNTER — Encounter (HOSPITAL_COMMUNITY): Payer: Self-pay | Admitting: Anesthesiology

## 2014-03-15 SURGERY — COLONOSCOPY WITH PROPOFOL
Anesthesia: Monitor Anesthesia Care

## 2014-03-15 MED ORDER — PROPOFOL 10 MG/ML IV BOLUS
INTRAVENOUS | Status: AC
  Filled 2014-03-15: qty 20

## 2014-03-15 NOTE — Anesthesia Preprocedure Evaluation (Deleted)
Anesthesia Evaluation  Patient identified by MRN, date of birth, ID band Patient awake    Reviewed: Allergy & Precautions, H&P , NPO status , Patient's Chart, lab work & pertinent test results  Airway Mallampati: II TM Distance: >3 FB Neck ROM: full    Dental no notable dental hx.    Pulmonary neg pulmonary ROS, shortness of breath and with exertion, former smoker,  breath sounds clear to auscultation  Pulmonary exam normal       Cardiovascular Exercise Tolerance: Good negative cardio ROS  Rhythm:regular Rate:Normal     Neuro/Psych Carotid artery occlusion negative psych ROS   GI/Hepatic negative GI ROS, Neg liver ROS,   Endo/Other  negative endocrine ROS  Renal/GU negative Renal ROS  negative genitourinary   Musculoskeletal   Abdominal   Peds  Hematology negative hematology ROS (+)   Anesthesia Other Findings   Reproductive/Obstetrics negative OB ROS                           Anesthesia Physical Anesthesia Plan  ASA: III  Anesthesia Plan: MAC   Post-op Pain Management:    Induction:   Airway Management Planned: Simple Face Mask  Additional Equipment:   Intra-op Plan:   Post-operative Plan:   Informed Consent: I have reviewed the patients History and Physical, chart, labs and discussed the procedure including the risks, benefits and alternatives for the proposed anesthesia with the patient or authorized representative who has indicated his/her understanding and acceptance.   Dental Advisory Given  Plan Discussed with: CRNA and Surgeon  Anesthesia Plan Comments:         Anesthesia Quick Evaluation

## 2014-03-16 ENCOUNTER — Other Ambulatory Visit: Payer: Self-pay | Admitting: Gastroenterology

## 2014-03-17 ENCOUNTER — Ambulatory Visit: Payer: Medicare Other | Admitting: Vascular Surgery

## 2014-03-17 ENCOUNTER — Other Ambulatory Visit (HOSPITAL_COMMUNITY): Payer: Medicare Other

## 2014-03-30 ENCOUNTER — Encounter: Payer: Self-pay | Admitting: Vascular Surgery

## 2014-03-31 ENCOUNTER — Ambulatory Visit (HOSPITAL_COMMUNITY)
Admission: RE | Admit: 2014-03-31 | Discharge: 2014-03-31 | Disposition: A | Discharge status: Home / Self Care | Payer: Medicare Other | Source: Ambulatory Visit | Attending: Vascular Surgery | Admitting: Vascular Surgery

## 2014-03-31 ENCOUNTER — Encounter: Payer: Self-pay | Admitting: Vascular Surgery

## 2014-03-31 ENCOUNTER — Ambulatory Visit (INDEPENDENT_AMBULATORY_CARE_PROVIDER_SITE_OTHER): Payer: Medicare Other | Admitting: Vascular Surgery

## 2014-03-31 VITALS — BP 126/71 | HR 94 | Ht 65.0 in | Wt 113.0 lb

## 2014-03-31 DIAGNOSIS — I6529 Occlusion and stenosis of unspecified carotid artery: Secondary | ICD-10-CM

## 2014-03-31 NOTE — Progress Notes (Signed)
History of Present Illness:  Patient is a 63 y.o. year old male who presents for evaluation of carotid stenosis.  He was last seen in September of 2014. At that time he was offered carotid angiogram to further evaluate his carotid stenosis but refused. The patient denies symptoms of TIA, amaurosis, or stroke.  The patient is on aspirin antiplatelet therapy.  The carotid stenosis was found CT Angio for evaluation of left neck mass several months ago. The patient has a prior history of head and neck cancer and radiation therapy. This was in 1999. He is currently followed by Dr. Owens Shark with ENT at [facility]. He is a former smoker but quit in 1998. He has had problems with dysphagia over the last year. He previously had a feeding tube but this has now been removed.  He is able to swallow some things.  He was previously offered carotid angiogram to study a severe right internal carotid artery stenosis but decided not to go through the procedure.    Past Medical History    Diagnosis   Date    .   Pain around PEG tube site       .   Radiation       .   Cancer           Past Surgical History    Procedure   Laterality   Date    .   Gastrostomy tube placement             Social History History    Substance Use Topics    .   Smoking status:   Former Smoker          Quit date:   04/18/1998    .   Smokeless tobacco:   Never Used    .   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   Heart disease   Sister             before age 53    .   Clotting disorder   Sister       .   Cancer   Brother       .   Heart disease   Brother       .   Heart attack   Brother         Allergies  No Known Allergies     Current Outpatient Prescriptions on File Prior to Visit  Medication Sig Dispense Refill  . aspirin 81  MG tablet Take 81 mg by mouth daily.      . Fluticasone Furoate-Vilanterol (BREO ELLIPTA) 100-25 MCG/INH AEPB Inhale 1 puff into the lungs daily.      . simvastatin (ZOCOR) 10 MG tablet Take 10 mg by mouth daily.       No current facility-administered medications on file prior to visit.    ROS:    General:   no weight loss, no Fever, chills  HEENT: No recent headaches, no nasal bleeding, no visual changes, +sore throat  Neurologic: No dizziness, blackouts, seizures. No recent symptoms of stroke or mini- stroke. No recent episodes of slurred speech, or  temporary blindness.  Cardiac: No recent episodes of chest pain/pressure, no shortness of breath at rest.  No shortness of breath with exertion.  Denies history of atrial fibrillation or irregular heartbeat  Vascular: No history of rest pain in feet.  No history of claudication.  No history of non-healing ulcer, No history of DVT    Pulmonary: No home oxygen, no productive cough, no hemoptysis,  No asthma or wheezing  Musculoskeletal:  [ ]  Arthritis, [ ]  Low back pain,  [ ]  Joint pain  Hematologic:No history of hypercoagulable state.  No history of easy bleeding.  No history of anemia  Gastrointestinal: No hematochezia or melena,  No gastroesophageal reflux, + trouble swallowing  Urinary: [ ]  chronic Kidney disease, [ ]  on HD - [ ]  MWF or [ ]  TTHS, [ ]  Burning with urination, [ ]  Frequent urination, [ ]  Difficulty urinating;    Skin: No rashes  Psychological: No history of anxiety,  No history of depression   Physical Examination  Filed Vitals:   03/31/14 1524 03/31/14 1525  BP: 126/75 126/71  Pulse: 94   Height: 5\' 5"  (1.651 m)   Weight: 113 lb (51.256 kg)   SpO2: 92%         General:  Alert and oriented, no acute distress, thin in appearance HEENT: Normal Neck: Right carotid bruit, no left carotid bruit Pulmonary: Clear to auscultation bilaterally Cardiac: Regular Rate and Rhythm without murmur Gastrointestinal:  Soft, non-tender, non-distended, no mass Skin: No rash Extremity Pulses:  2+ radial, brachial, femoral pulses bilaterally Musculoskeletal: No deformity or edema     Neurologic: Upper and lower extremity motor 5/5 and symmetric, muffled speech more likely due to pharyngeal weakness from radiation rather than neuro deficit  Data: Carotid duplex scan was performed today. I reviewed and interpreted this study. This shows a 80-99% right internal carotid artery stenosis with velocities similar to prior scan. There is some tortuosity to the vessel. Left internal carotid artery is 40-60%.  ASSESSMENT: High-grade right internal carotid artery stenosis asymptomatic. This is most likely atherosclerotic in nature but certainly could have radiation contribution.    PLAN:  The patient is still reluctant to proceed with carotid angiogram. He was informed of the risk of stroke of approximately 5% per year. He will call back if he wishes to proceed with carotid angiography with consideration for stenting or carotid endarterectomy. Otherwise he will followup with repeat duplex scan in 6 months and continue his aspirin.Ruta Hinds, MD [facility] [place] Office: [phone] Pager: [phone]

## 2014-04-04 NOTE — Addendum Note (Signed)
Addended by: Mena Goes on: 04/04/2014 05:08 PM   Modules accepted: Orders

## 2014-04-11 ENCOUNTER — Encounter (HOSPITAL_COMMUNITY): Payer: Self-pay | Admitting: Pharmacy Technician

## 2014-04-18 ENCOUNTER — Encounter (HOSPITAL_COMMUNITY): Payer: Self-pay | Admitting: *Deleted

## 2014-05-03 ENCOUNTER — Encounter (HOSPITAL_COMMUNITY): Payer: Self-pay | Admitting: Certified Registered Nurse Anesthetist

## 2014-05-03 ENCOUNTER — Ambulatory Visit (HOSPITAL_COMMUNITY): Admission: RE | Admit: 2014-05-03 | Payer: Medicare Other | Source: Ambulatory Visit | Admitting: Gastroenterology

## 2014-05-03 HISTORY — DX: Personal history of irradiation: Z92.3

## 2014-05-03 SURGERY — COLONOSCOPY WITH PROPOFOL
Anesthesia: Monitor Anesthesia Care

## 2014-05-03 MED ORDER — FENTANYL CITRATE 0.05 MG/ML IJ SOLN
INTRAMUSCULAR | Status: AC
  Filled 2014-05-03: qty 2

## 2014-05-03 MED ORDER — PROPOFOL 10 MG/ML IV BOLUS
INTRAVENOUS | Status: AC
  Filled 2014-05-03: qty 20

## 2014-09-28 ENCOUNTER — Encounter: Payer: Self-pay | Admitting: Vascular Surgery

## 2014-09-29 ENCOUNTER — Other Ambulatory Visit: Payer: Self-pay | Admitting: Vascular Surgery

## 2014-09-29 ENCOUNTER — Encounter: Payer: Self-pay | Admitting: Vascular Surgery

## 2014-09-29 ENCOUNTER — Ambulatory Visit (HOSPITAL_COMMUNITY)
Admission: RE | Admit: 2014-09-29 | Discharge: 2014-09-29 | Disposition: A | Discharge status: Home / Self Care | Payer: Medicare Other | Source: Ambulatory Visit | Attending: Vascular Surgery | Admitting: Vascular Surgery

## 2014-09-29 ENCOUNTER — Ambulatory Visit (INDEPENDENT_AMBULATORY_CARE_PROVIDER_SITE_OTHER): Payer: Medicare Other | Admitting: Vascular Surgery

## 2014-09-29 VITALS — BP 155/89 | HR 64 | Ht 65.0 in | Wt 115.0 lb

## 2014-09-29 DIAGNOSIS — I6523 Occlusion and stenosis of bilateral carotid arteries: Secondary | ICD-10-CM

## 2014-09-29 DIAGNOSIS — I6529 Occlusion and stenosis of unspecified carotid artery: Secondary | ICD-10-CM | POA: Diagnosis present

## 2014-09-29 MED ORDER — SIMVASTATIN 10 MG PO TABS: 10.0000 mg | ORAL_TABLET | Freq: Every evening | ORAL | Status: AC

## 2014-09-29 NOTE — Patient Instructions (Signed)
Stroke Prevention Some health problems and behaviors may make it more likely for you to have a stroke. Below are ways to lessen your risk of having a stroke.   Be active for at least 30 minutes on most or all days.  Do not smoke. Try not to be around others who smoke.  Do not drink too much alcohol.  Do not have more than 2 drinks a day if you are a man.  Do not have more than 1 drink a day if you are a woman and are not pregnant.  Eat healthy foods, such as fruits and vegetables. If you were put on a specific diet, follow the diet as told.  Keep your cholesterol levels under control through diet and medicines. Look for foods that are low in saturated fat, trans fat, cholesterol, and are high in fiber.  If you have diabetes, follow all diet plans and take your medicine as told.  If you have high blood pressure (hypertension), follow all diet plans and take your medicine as told.  Keep a healthy weight. Eat foods that are low in calories, salt, saturated fat, trans fat, and cholesterol.  Do not take drugs.  Avoid birth control pills, if this applies. Talk to your doctor about the risks of taking birth control pills.  Talk to your doctor if you have sleep problems (sleep apnea).  Take all medicine as told by your doctor.  You may be told to take aspirin or blood thinner medicine. Take this medicine as told by your doctor.  Understand your medicine instructions.  Make sure any other conditions you have are being taken care of. GET HELP RIGHT AWAY IF:  You suddenly lose feeling (you feel numb) or have weakness in your face, arm, or leg.  Your face or eyelid hangs down to one side.  You suddenly feel confused.  You have trouble talking (aphasia) or understanding what people are saying.  You suddenly have trouble seeing in one or both eyes.  You suddenly have trouble walking.  You are dizzy.  You lose your balance or your movements are clumsy (uncoordinated).  You  suddenly have a very bad headache and you do not know the cause.  You have new chest pain.  Your heart feels like it is fluttering or skipping a beat (irregular heartbeat). Do not wait to see if the symptoms above go away. Get help right away. Call your local emergency services (911 in [place] Do not drive yourself to the hospital. Document Released: 06/16/2012 Document Revised: 05/02/2014 Document Reviewed: 06/18/2013 [facility] Patient Information 2015 [place], [place]. This information is not intended to replace advice given to you by your health care provider. Make sure you discuss any questions you have with your health care provider.

## 2014-09-29 NOTE — Progress Notes (Addendum)
HISTORY AND PHYSICAL     CC:  Follow up carotid duplex scan  Referring Provider:  Kandice Hams, MD  HPI: This is a 63 y.o. male who has known carotid stenosis is here for f/u carotid duplex scan.  Denies amaurosis fugax, paresthesias, or hemiparesis.  He was last seen in April 2015 by Dr. Oneida Alar.  At that time, he was offered carotid angiogram to further evaluate his carotid stenosis and he refused.  His carotid stenosis was found on carotid angiogram when he was evaluated for his left neck mass.  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 Owens Shark.  He has remote tobacco hx and quit in 1999.  He did develop some swallowing problems from the radiation and did require a feeding tube and this has since been removed.    He is on a statin for hypercholesterolemia and states he needs a refill for this.  He is also on aspirin antiplatelet therapay.  Past Medical History  Diagnosis Date  . Pain around PEG tube site   . Radiation   . Carotid artery occlusion   . Cancer 1999    head and neck  . History of radiation therapy 1999   Past Surgical History  Procedure Laterality Date  . Gastrostomy tube placement  2013    TUBE LATER REMOVED in 2013    No Known Allergies  Current Outpatient Prescriptions  Medication Sig Dispense Refill  . aspirin 81 MG tablet Take 81 mg by mouth daily.      . Fluticasone Furoate-Vilanterol (BREO ELLIPTA) 100-25 MCG/INH AEPB Inhale 1 puff into the lungs daily.      Marland Kitchen guaiFENesin-dextromethorphan (ROBITUSSIN DM) 100-10 MG/5ML syrup Take 5 mLs by mouth every 4 (four) hours as needed for cough.      . Phenyleph-Doxylamine-DM-APAP (ALKA SELTZER PLUS PO) Take 1 tablet by mouth every 6 (six) hours as needed (cold/flu symptoms).      . simvastatin (ZOCOR) 10 MG tablet Take 10 mg by mouth every evening.        No current facility-administered medications for this visit.    Pt's meds include: Statin:   Yes.   Beta Blocker:  No. Aspirin:  Yes.   Other antiplatelets/anticoagulants:  No.   Family History  Problem Relation Age of Onset  . Heart disease Sister     before age 20  . Clotting disorder Sister   . Cancer Brother   . Heart disease Brother   . Heart attack Brother     History   Social History  . Marital Status: Married    Spouse Name: N/A    Number of Children: N/A  . Years of Education: N/A   Occupational History  . Not on file.   Social History Main Topics  . Smoking status: Former Smoker    Types: Cigarettes    Quit date: 04/18/1998  . Smokeless tobacco: Never Used  . Alcohol Use: No     Comment: pt's wife states that he quit drinking in 1997, pt use to drink about 80 beers per week and 900 shots of liquor per week  . Drug Use: No  . Sexual Activity: Not on file   Other Topics Concern  . Not on file   Social History Narrative  . No narrative on file     ROS: [x]  Positive   [ ]  Negative   [ ]  All sytems reviewed  and are negative  Cardiovascular: []  chest pain/pressure []  palpitations []  SOB lying flat []  DOE []  pain in legs while walking []  pain in feet when lying flat []  hx of DVT []  hx of phlebitis []  swelling in legs []  varicose veins  Pulmonary: []  productive cough []  asthma []  wheezing  Neurologic: []  weakness in []  arms []  legs []  numbness in []  arms []  legs [] difficulty speaking or slurred speech []  temporary loss of vision in one eye []  dizziness  Hematologic: []  bleeding problems []  problems with blood clotting easily  GI []  vomiting blood []  blood in stool  GU: []  burning with urination []  blood in urine  Psychiatric: []  hx of major depression  Integumentary: []  rashes []  ulcers  Constitutional: []  fever []  chills   PHYSICAL EXAMINATION:  Filed Vitals:   09/29/14 1550  BP: 155/89  Pulse:    Body mass index is 19.14 kg/(m^2).  General:  Thin male in NAD Gait: Normal HENT: WNL; normocephalic,  minimal skin changes from radiation Pulmonary: normal non-labored breathing , without Rales, rhonchi,  wheezing Cardiac: RRR, without  Murmurs, rubs or gallops; without carotid bruits Abdomen: soft, NT, no masses Skin: without rashes,  ulcers  Vascular Exam/Pulses:   Right Left  Radial 2+ (normal) 2+ (normal)  Ulnar Unable to palpate  Unable to palpate   Popliteal Unable to palpate  Unable to palpate   DP Unable to palpate  Unable to palpate   PT 2+ (normal) 2+ (normal)   Extremities: without ischemic changes, without Gangrene , without cellulitis; without open wounds;   Neurologic: A&O X 3; Appropriate Affect ; SENSATION: normal; MOTOR FUNCTION:  moving all extremities equally. Speech is fluent/normal   Non-Invasive Vascular Imaging: Carotid Duplex Scan:  09/29/2014  1.  Right ICA stenosis present in the 80-99% range 2.  Left ICA stenosis present in the 40-59% range mid segment, vessel tortuosity present. 3.  Right ECA stenosis present   ---Unchanged since previous study on 03/31/14  ASSESSMENT: 63 y.o. male here for f/u carotid duplex scan with hx of head/neck cancer and radiation tx.   PLAN: -pt continues to be asymptomatic from his carotid stenosis. -pt still refusing carotid angiogram at this time and wants to wait until the spring.  Dr. Oneida Alar discussed with the pt and his wife the risks of waiting compared to the risk with proceeding.   -pt and his wife are told the symptoms to look for and proceed to the emergency room if he develops these symptoms.  They are given stroke information in their AVS. -If he decides to proceed before the spring, he will contact us.   Leontine Locket, PA-C [facility] [phone]  Clinic MD:   Pt seen and examined in conjunction with Dr. Oneida Alar  History exam details as above. Patient previously followed on several prior occasions for high-grade right internal carotid artery stenosis. He continues to remain  asymptomatic. I again offered him possible right carotid endarterectomy. He currently wishes to continue with medical management. He wishes to followup again in May of next year. We will see him at that time.  Ruta Hinds, MD [facility] [place] Office: [phone] Pager: [phone]   For VQI Use Only     PRE-ADM LIVING: [x ] Home, [ ]  Nursing home, [ ]  Homeless  AMB STATUS: [x ] Walking, [ ]  Walking w/ Assistance, [ ]  Wheelchair, [ ] Bed ridden  [place] (<6 mon): No  CAD Sx: [ x]  No, [ ]  Asx, h/o MI, [ ]  Stable angina, [ ]  Unstable angina  PRIOR CHF: [ x] No, [ ]  Asx, [ ]  Mild, [ ]  Moderate, [ ]  Severe  STRESS TEST: [x ] No, [ ]  Normal, [ ]  + ischemia, [ ]  + MI, [ ]  Both

## 2014-09-30 NOTE — Addendum Note (Signed)
Addended by: Dorthula Rue L on: 09/30/2014 12:29 PM   Modules accepted: Orders

## 2014-11-14 ENCOUNTER — Emergency Department (HOSPITAL_COMMUNITY)
Admission: EM | Admit: 2014-11-14 | Discharge: 2014-11-14 | Discharge status: Against Medical Advice | Payer: Medicare Other | Attending: Emergency Medicine | Admitting: Emergency Medicine

## 2014-11-14 ENCOUNTER — Encounter (HOSPITAL_COMMUNITY): Payer: Self-pay | Admitting: *Deleted

## 2014-11-14 DIAGNOSIS — J029 Acute pharyngitis, unspecified: Secondary | ICD-10-CM | POA: Diagnosis not present

## 2014-11-14 DIAGNOSIS — H9202 Otalgia, left ear: Secondary | ICD-10-CM | POA: Diagnosis not present

## 2014-11-14 DIAGNOSIS — Z87891 Personal history of nicotine dependence: Secondary | ICD-10-CM | POA: Diagnosis not present

## 2014-11-14 NOTE — ED Notes (Addendum)
Pt reports having left ear pain and throat pain when he swallows x 2 days. Hx of neck cancer. Airway intact at triage.

## 2014-11-14 NOTE — ED Notes (Signed)
Pt called no answer 

## 2014-11-30 ENCOUNTER — Ambulatory Visit (INDEPENDENT_AMBULATORY_CARE_PROVIDER_SITE_OTHER): Payer: Medicare Other | Admitting: Neurology

## 2014-11-30 ENCOUNTER — Encounter: Payer: Self-pay | Admitting: Neurology

## 2014-11-30 VITALS — BP 122/84 | HR 77 | Resp 16 | Ht 66.0 in | Wt 117.0 lb

## 2014-11-30 DIAGNOSIS — C051 Malignant neoplasm of soft palate: Secondary | ICD-10-CM

## 2014-11-30 DIAGNOSIS — R413 Other amnesia: Secondary | ICD-10-CM

## 2014-11-30 LAB — BUN: BUN: 11 mg/dL (ref 6–23)

## 2014-11-30 LAB — CREATININE, SERUM: Creat: 1.1 mg/dL (ref 0.50–1.35)

## 2014-11-30 MED ORDER — DONEPEZIL HCL 10 MG PO TABS
ORAL_TABLET | ORAL | Status: DC
Start: 2014-11-30 — End: 2015-03-01

## 2014-11-30 NOTE — Patient Instructions (Signed)
1. MRI brain with and without contrast 2. Start Aricept 10mg : Take 1/2 tablet daily for 1 week, then increase to 1 tablet daily 3. Follow-up in 3 months

## 2014-11-30 NOTE — Progress Notes (Signed)
NEUROLOGY CONSULTATION NOTE  Bryan Arellano MRN: [id number] DOB: 01/23/1951  Referring provider: Dr. Seward Carol Primary care provider: Dr. Seward Carol  Reason for consult:  Memory loss  Dear Dr Delfina Redwood:  Thank you for your kind referral of Bryan Arellano for consultation of the above symptoms. Although his history is well known to you, please allow me to reiterate it for the purpose of our medical record. The patient was accompanied to the clinic by his wife who also provides collateral information. Records and images were personally reviewed where available.  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 He reports he forgets names but denies other difficulties. His wife reports that he regularly misplaces things at home. He would ask the same questions repeatedly. She denies any difficulties following instructions, no missed medications. He does not drive. His wife has always been in charge of bills. She denies any personality changes, no hallucinations. His father and sister were diagnosed with Alzheimer's disease.  He denies any frequent headaches, dizziness, diplopia, focal numbness/tingling/weakness, back pain, bowel/bladder dysfunction. No anosmia or tremors. He has dysphagia and dysarthria that worsened in 2013.     Laboratory Data: TSH and B12 normal  PAST MEDICAL HISTORY: Past Medical History  Diagnosis Date  . Pain around PEG tube site   . Radiation   . Carotid artery occlusion   . Cancer 1999    head and neck  . History of radiation therapy 1999    PAST SURGICAL HISTORY: Past Surgical History  Procedure Laterality Date  . Gastrostomy tube placement  2013    TUBE LATER REMOVED in 2013    MEDICATIONS: Current  Outpatient Prescriptions on File Prior to Visit  Medication Sig Dispense Refill  . aspirin 81 MG tablet Take 81 mg by mouth daily.    . simvastatin (ZOCOR) 10 MG tablet Take 1 tablet (10 mg total) by mouth every evening. 30 tablet 0   No current facility-administered medications on file prior to visit.    ALLERGIES: No Known Allergies  FAMILY HISTORY: Family History  Problem Relation Age of Onset  . Heart disease Sister     before age 65  . Clotting disorder Sister   . Cancer Brother   . Heart disease Brother   . Heart attack Brother     SOCIAL HISTORY: History   Social History  . Marital Status: Married    Spouse Name: N/A    Number of Children: N/A  . Years of Education: N/A   Occupational History  . Not on file.   Social History Main Topics  . Smoking status: Former Smoker    Types: Cigarettes    Quit date: 04/18/1998  . Smokeless tobacco: Never Used  . Alcohol Use: No     Comment: pt's wife states that he quit drinking in 1997, pt use to drink about 80 beers per week and 900 shots of liquor per week  . Drug Use: No  . Sexual Activity: Not on file   Other Topics Concern  . Not on file   Social History Narrative    REVIEW OF SYSTEMS: Constitutional: No fevers, chills, or sweats, no generalized fatigue, change in appetite Eyes: No visual changes, double vision, eye pain Ear, nose and throat: No hearing loss, ear pain, nasal congestion,  sore throat Cardiovascular: No chest pain, palpitations Respiratory:  No shortness of breath at rest or with exertion, wheezes GastrointestinaI: No nausea, vomiting, diarrhea, abdominal pain, fecal incontinence Genitourinary:  No dysuria, urinary retention or frequency Musculoskeletal:  No neck pain, back pain Integumentary: No rash, pruritus, skin lesions Neurological: as above Psychiatric: No depression, insomnia, anxiety Endocrine: No palpitations, fatigue, diaphoresis, mood swings, change in appetite, change in weight,  increased thirst Hematologic/Lymphatic:  No anemia, purpura, petechiae. Allergic/Immunologic: no itchy/runny eyes, nasal congestion, recent allergic reactions, rashes  PHYSICAL EXAM: Filed Vitals:   11/30/14 0806  BP: 122/84  Pulse: 77  Resp: 16   General: No acute distress Head:  Normocephalic/atraumatic Eyes: Fundoscopic exam shows bilateral sharp discs, no vessel changes, exudates, or hemorrhages Neck: supple, no paraspinal tenderness, full range of motion Back: No paraspinal tenderness Heart: regular rate and rhythm Lungs: Clear to auscultation bilaterally. Vascular: No carotid bruits. Skin/Extremities: No rash, no edema Neurological Exam: Mental status: alert and oriented to person, place, month. He did not know year, date, or his age. +moderate dysarthria, no aphasia, Fund of knowledge is appropriate.  Remote memory intact.  Attention and concentration are normal.    Able to name objects and repeat phrases.  MMSE - Mini Mental State Exam 11/30/2014  Orientation to time 1  Orientation to Place 3  Registration 3  Attention/ Calculation 4  Recall 0  Language- name 2 objects 2  Language- repeat 1  Language- follow 3 step command 3  Language- read & follow direction 1  Write a sentence 1  Copy design 1  Total score 20   Cranial nerves: CN I: not tested CN II: pupils equal, round and reactive to light, visual fields intact, fundi unremarkable. CN III, IV, VI:  full range of motion, no nystagmus, no ptosis CN V: facial sensation intact CN VII: upper and lower face symmetric CN VIII: hearing intact to finger rub CN IX, X: gag intact, uvula midline CN XI: sternocleidomastoid and trapezius muscles intact CN XII: tongue midline Bulk & Tone: normal, no cogwheeling, no fasciculations. Motor: 5/5 throughout with no pronator drift. Sensation: intact to light touch, cold, pin, vibration and joint position sense.  No extinction to double simultaneous stimulation.  Romberg test  negative Deep Tendon Reflexes: +2 throughout, no ankle clonus Plantar responses: downgoing bilaterally Cerebellar: no incoordination on finger to nose, heel to shin. No dysdiadochokinesia Gait: narrow-based and steady, able to tandem walk adequately. 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 MMSE today is 20/30, indicating mild cognitive impairment. He is dysarthric from history of soft palate cancer s/p radiation, otherwise non-focal neurological exam. We discussed different causes of memory loss, TSH and B12 normal. MRI brain with and without contrast will be ordered to assess for underlying structural abnormality. With his history of soft palate cancer, brain metastasis should be ruled out. He may benefit from starting a cholinesterase inhibitor such as Aricept, side effects and expectations from the medication were discussed. He will start 5mg  daily x 1 week, then increase to 10mg  daily. The importance of physical and brain stimulation exercises, as well as control of vascular risk factors for brain health were discussed. He will follow-up in 3 months.  Thank you for allowing me to participate in the care of this patient. Please  do not hesitate to call for any questions or concerns.   Ellouise Newer, M.D.  CC: Dr. Delfina Redwood

## 2014-12-01 ENCOUNTER — Encounter: Payer: Self-pay | Admitting: Neurology

## 2014-12-02 ENCOUNTER — Ambulatory Visit (HOSPITAL_COMMUNITY)
Admission: RE | Admit: 2014-12-02 | Discharge: 2014-12-02 | Disposition: A | Discharge status: Home / Self Care | Payer: Medicare Other | Source: Ambulatory Visit | Attending: Neurology | Admitting: Neurology

## 2014-12-02 DIAGNOSIS — R413 Other amnesia: Secondary | ICD-10-CM | POA: Diagnosis not present

## 2014-12-02 DIAGNOSIS — C051 Malignant neoplasm of soft palate: Secondary | ICD-10-CM | POA: Insufficient documentation

## 2014-12-02 MED ORDER — GADOBENATE DIMEGLUMINE 529 MG/ML IV SOLN
10.0000 mL | Freq: Once | INTRAVENOUS | Status: AC | PRN
  Administered 2014-12-02: 10 mL via INTRAVENOUS

## 2015-01-28 ENCOUNTER — Emergency Department (HOSPITAL_COMMUNITY): Payer: Medicare Other

## 2015-01-28 ENCOUNTER — Encounter (HOSPITAL_COMMUNITY): Payer: Self-pay | Admitting: Emergency Medicine

## 2015-01-28 ENCOUNTER — Emergency Department (HOSPITAL_COMMUNITY)
Admission: EM | Admit: 2015-01-28 | Discharge: 2015-01-28 | Disposition: A | Discharge status: Home / Self Care | Payer: Medicare Other | Attending: Emergency Medicine | Admitting: Emergency Medicine

## 2015-01-28 DIAGNOSIS — L988 Other specified disorders of the skin and subcutaneous tissue: Secondary | ICD-10-CM | POA: Diagnosis present

## 2015-01-28 DIAGNOSIS — Z87891 Personal history of nicotine dependence: Secondary | ICD-10-CM | POA: Diagnosis not present

## 2015-01-28 DIAGNOSIS — J181 Lobar pneumonia, unspecified organism: Secondary | ICD-10-CM

## 2015-01-28 DIAGNOSIS — Z923 Personal history of irradiation: Secondary | ICD-10-CM | POA: Insufficient documentation

## 2015-01-28 DIAGNOSIS — J189 Pneumonia, unspecified organism: Secondary | ICD-10-CM

## 2015-01-28 DIAGNOSIS — Z792 Long term (current) use of antibiotics: Secondary | ICD-10-CM | POA: Insufficient documentation

## 2015-01-28 DIAGNOSIS — L03115 Cellulitis of right lower limb: Secondary | ICD-10-CM | POA: Diagnosis not present

## 2015-01-28 DIAGNOSIS — Z85820 Personal history of malignant melanoma of skin: Secondary | ICD-10-CM | POA: Diagnosis not present

## 2015-01-28 DIAGNOSIS — Z7982 Long term (current) use of aspirin: Secondary | ICD-10-CM | POA: Diagnosis not present

## 2015-01-28 LAB — BASIC METABOLIC PANEL
ANION GAP: 9 (ref 5–15)
BUN: 14 mg/dL (ref 6–23)
CHLORIDE: 105 mmol/L (ref 96–112)
CO2: 25 mmol/L (ref 19–32)
Calcium: 8.7 mg/dL (ref 8.4–10.5)
Creatinine, Ser: 0.79 mg/dL (ref 0.50–1.35)
GFR calc non Af Amer: >90 mL/min (ref >90)
GLUCOSE: 86 mg/dL (ref 70–99)
Potassium: 3.6 mmol/L (ref 3.5–5.1)
Sodium: 139 mmol/L (ref 135–145)

## 2015-01-28 LAB — CBC WITH DIFFERENTIAL/PLATELET
BASOS ABS: 0 10*3/uL (ref 0.0–0.1)
BASOS PCT: 0 % (ref 0–1)
EOS ABS: 0.1 10*3/uL (ref 0.0–0.7)
Eosinophils Relative: 1 % (ref 0–5)
HEMATOCRIT: 34.8 % — AB (ref 39.0–52.0)
Hemoglobin: 11.2 g/dL — ABNORMAL LOW (ref 13.0–17.0)
LYMPHS ABS: 1.2 10*3/uL (ref 0.7–4.0)
LYMPHS PCT: 13 % (ref 12–46)
MCH: 29.6 pg (ref 26.0–34.0)
MCHC: 32.2 g/dL (ref 30.0–36.0)
MCV: 91.8 fL (ref 78.0–100.0)
Monocytes Absolute: 0.5 10*3/uL (ref 0.1–1.0)
Monocytes Relative: 6 % (ref 3–12)
Neutro Abs: 7.7 10*3/uL (ref 1.7–7.7)
Neutrophils Relative %: 80 % — ABNORMAL HIGH (ref 43–77)
PLATELETS: 446 10*3/uL — AB (ref 150–400)
RBC: 3.79 MIL/uL — AB (ref 4.22–5.81)
RDW: 13.3 % (ref 11.5–15.5)
WBC: 9.5 10*3/uL (ref 4.0–10.5)

## 2015-01-28 MED ORDER — METRONIDAZOLE 500 MG PO TABS: 500.0000 mg | ORAL_TABLET | Freq: Two times a day (BID) | ORAL | Status: DC

## 2015-01-28 MED ORDER — VANCOMYCIN HCL IN DEXTROSE 750-5 MG/150ML-% IV SOLN
750.0000 mg | Freq: Two times a day (BID) | INTRAVENOUS | Status: DC
  Filled 2015-01-28: qty 150

## 2015-01-28 MED ORDER — VANCOMYCIN HCL 500 MG IV SOLR
500.0000 mg | Freq: Two times a day (BID) | INTRAVENOUS | Status: DC
  Filled 2015-01-28: qty 500

## 2015-01-28 MED ORDER — SULFAMETHOXAZOLE-TRIMETHOPRIM 800-160 MG PO TABS: 1.0000 | ORAL_TABLET | Freq: Two times a day (BID) | ORAL | Status: AC

## 2015-01-28 MED ORDER — VANCOMYCIN HCL IN DEXTROSE 1-5 GM/200ML-% IV SOLN
1000.0000 mg | INTRAVENOUS | Status: AC
  Administered 2015-01-28: 1000 mg via INTRAVENOUS
  Filled 2015-01-28: qty 200

## 2015-01-28 MED ORDER — SODIUM CHLORIDE 0.9 % IV SOLN
3.0000 g | Freq: Once | INTRAVENOUS | Status: AC
  Administered 2015-01-28: 3 g via INTRAVENOUS
  Filled 2015-01-28: qty 3

## 2015-01-28 NOTE — Progress Notes (Addendum)
ANTIBIOTIC CONSULT NOTE - INITIAL  Pharmacy Consult for vancomycin Indication: celllulitis  No Known Allergies  Patient Measurements:   Adjusted Body Weight:   Vital Signs: Temp: 97.7 F (36.5 C) (01/30 1151) Temp Source: Oral (01/30 1151) BP: 138/82 mmHg (01/30 1151) Pulse Rate: 85 (01/30 1151) Intake/Output from previous day:   Intake/Output from this shift:    Labs:  Recent Labs  01/28/15 1249  WBC 9.5  HGB 11.2*  PLT 446*  CREATININE 0.79   CrCl cannot be calculated (Unknown ideal weight.). No results for input(s): VANCOTROUGH, VANCOPEAK, VANCORANDOM, GENTTROUGH, GENTPEAK, GENTRANDOM, TOBRATROUGH, TOBRAPEAK, TOBRARND, AMIKACINPEAK, AMIKACINTROU, AMIKACIN in the last 72 hours.   Microbiology: No results found for this or any previous visit (from the past 720 hour(s)).  Medical History: Past Medical History  Diagnosis Date  . Pain around PEG tube site   . Radiation   . Carotid artery occlusion   . Cancer 1999    head and neck  . History of radiation therapy 1999  . Cancer    Assessment: 30 YOM present with redness of legs, arms, and hands.  Orders to start vancomycin for possible cellulitis.    1/30 >> vancomycin  >>  Tmax: afeb WBCs: WNL Renal: SCr WNl, normalized CrCl = 53ml/min  No cultures currently ordered  Goal of Therapy:  Vancomycin trough level 10-15 mcg/ml  Plan:   vancomcyin 1gm IV x 1 then 500mg  IV q12h  Follow renal function and check trough as indicated  Doreene Eland, PharmD, BCPS.   Pager: [phone]  01/28/2015,1:38 PM

## 2015-01-28 NOTE — Discharge Instructions (Signed)
Return to the emergency room with worsening of symptoms, new symptoms or with symptoms that are concerning, especially fevers, worsening redness and skin infection, cough, nausea, vomiting, generalized ill, weakness. Please take all of your antibiotics until finished!   You may develop abdominal discomfort or diarrhea from the antibiotic.  You may help offset this with probiotics which you can buy or get in yogurt. Do not eat  or take the probiotics until 2 hours after your antibiotic.  Please call your doctor for a followup appointment within 24-48 hours. Should be reevaluated on Monday. When you talk to your doctor please let them know that you were seen in the emergency department and have them acquire all of your records so that they can discuss the findings with you and formulate a treatment plan to fully care for your new and ongoing problems.  Read all below information and follow recommendations.  Pneumonia Pneumonia is an infection of the lungs.  CAUSES Pneumonia may be caused by bacteria or a virus. Usually, these infections are caused by breathing infectious particles into the lungs (respiratory tract). SIGNS AND SYMPTOMS   Cough.  Fever.  Chest pain.  Increased rate of breathing.  Wheezing.  Mucus production. DIAGNOSIS  If you have the common symptoms of pneumonia, your health care provider will typically confirm the diagnosis with a chest X-ray. The X-ray will show an abnormality in the lung (pulmonary infiltrate) if you have pneumonia. Other tests of your blood, urine, or sputum may be done to find the specific cause of your pneumonia. Your health care provider may also do tests (blood gases or pulse oximetry) to see how well your lungs are working. TREATMENT  Some forms of pneumonia may be spread to other people when you cough or sneeze. You may be asked to wear a mask before and during your exam. Pneumonia that is caused by bacteria is treated with antibiotic medicine.  Pneumonia that is caused by the influenza virus may be treated with an antiviral medicine. Most other viral infections must run their course. These infections will not respond to antibiotics.  HOME CARE INSTRUCTIONS   Cough suppressants may be used if you are losing too much rest. However, coughing protects you by clearing your lungs. You should avoid using cough suppressants if you can.  Your health care provider may have prescribed medicine if he or she thinks your pneumonia is caused by bacteria or influenza. Finish your medicine even if you start to feel better.  Your health care provider may also prescribe an expectorant. This loosens the mucus to be coughed up.  Take medicines only as directed by your health care provider.  Do not smoke. Smoking is a common cause of bronchitis and can contribute to pneumonia. If you are a smoker and continue to smoke, your cough may last several weeks after your pneumonia has cleared.  A cold steam vaporizer or humidifier in your room or home may help loosen mucus.  Coughing is often worse at night. Sleeping in a semi-upright position in a recliner or using a couple pillows under your head will help with this.  Get rest as you feel it is needed. Your body will usually let you know when you need to rest. PREVENTION A pneumococcal shot (vaccine) is available to prevent a common bacterial cause of pneumonia. This is usually suggested for:  People over 67 years old.  Patients on chemotherapy.  People with chronic lung problems, such as bronchitis or emphysema.  People with immune  system problems. If you are over 65 or have a high risk condition, you may receive the pneumococcal vaccine if you have not received it before. In some countries, a routine influenza vaccine is also recommended. This vaccine can help prevent some cases of pneumonia.You may be offered the influenza vaccine as part of your care. If you smoke, it is time to quit. You may receive  instructions on how to stop smoking. Your health care provider can provide medicines and counseling to help you quit. SEEK MEDICAL CARE IF: You have a fever. SEEK IMMEDIATE MEDICAL CARE IF:   Your illness becomes worse. This is especially true if you are elderly or weakened from any other disease.  You cannot control your cough with suppressants and are losing sleep.  You begin coughing up blood.  You develop pain which is getting worse or is uncontrolled with medicines.  Any of the symptoms which initially brought you in for treatment are getting worse rather than better.  You develop shortness of breath or chest pain. MAKE SURE YOU:   Understand these instructions.  Will watch your condition.  Will get help right away if you are not doing well or get worse. Document Released: 12/16/2005 Document Revised: 05/02/2014 Document Reviewed: 03/07/2011 [facility] Patient Information 2015 [place], [place]. This information is not intended to replace advice given to you by your health care provider. Make sure you discuss any questions you have with your health care provider.   Cellulitis Cellulitis is an infection of the skin and the tissue beneath it. The infected area is usually red and tender. Cellulitis occurs most often in the arms and lower legs.  CAUSES  Cellulitis is caused by bacteria that enter the skin through cracks or cuts in the skin. The most common types of bacteria that cause cellulitis are staphylococci and streptococci. SIGNS AND SYMPTOMS   Redness and warmth.  Swelling.  Tenderness or pain.  Fever. DIAGNOSIS  Your health care provider can usually determine what is wrong based on a physical exam. Blood tests may also be done. TREATMENT  Treatment usually involves taking an antibiotic medicine. HOME CARE INSTRUCTIONS   Take your antibiotic medicine as directed by your health care provider. Finish the antibiotic even if you start to feel better.  Keep the  infected arm or leg elevated to reduce swelling.  Apply a warm cloth to the affected area up to 4 times per day to relieve pain.  Take medicines only as directed by your health care provider.  Keep all follow-up visits as directed by your health care provider. SEEK MEDICAL CARE IF:   You notice red streaks coming from the infected area.  Your red area gets larger or turns dark in color.  Your bone or joint underneath the infected area becomes painful after the skin has healed.  Your infection returns in the same area or another area.  You notice a swollen bump in the infected area.  You develop new symptoms.  You have a fever. SEEK IMMEDIATE MEDICAL CARE IF:   You feel very sleepy.  You develop vomiting or diarrhea.  You have a general ill feeling (malaise) with muscle aches and pains. MAKE SURE YOU:   Understand these instructions.  Will watch your condition.  Will get help right away if you are not doing well or get worse. Document Released: 09/25/2005 Document Revised: 05/02/2014 Document Reviewed: 03/02/2012 [facility] Patient Information 2015 [place], [place]. This information is not intended to replace advice given to you by  your health care provider. Make sure you discuss any questions you have with your health care provider.

## 2015-01-28 NOTE — ED Provider Notes (Signed)
CSN: [id number]     Arrival date & time 01/28/15  1145 History   First MD Initiated Contact with Patient 01/28/15 1225     Chief Complaint  Patient presents with  . Skin Problem     (Consider location/radiation/quality/duration/timing/severity/associated sxs/prior Treatment) HPI  Bryan Arellano is a 64 y.o. male with PMH of soft palate squamous carcinoma remission since 2000, carotid artery occlusion presenting with red raised area on right dorsal hand that started last night and has subsequently spread to his forearm. Patient with tenderness and warmth to the area for full range of motion. She denies numbness, tingling, weakness. No history of fevers or chills. No nausea or vomiting. Patient has had persistent cough that is gotten worse in the last 1 week with thick yellowish sputum. Some rhinorrhea that are not new. Patient former smoker and alcohol user. Nothing since.   Past Medical History  Diagnosis Date  . Pain around PEG tube site   . Radiation   . Carotid artery occlusion   . Cancer 1999    head and neck  . History of radiation therapy 1999  . Cancer    Past Surgical History  Procedure Laterality Date  . Gastrostomy tube placement  2013    TUBE LATER REMOVED in 2013   Family History  Problem Relation Age of Onset  . Heart disease Sister     before age 50  . Clotting disorder Sister   . Cancer Brother   . Heart disease Brother   . Heart attack Brother    History  Substance Use Topics  . Smoking status: Former Smoker    Types: Cigarettes    Quit date: 04/18/1998  . Smokeless tobacco: Never Used  . Alcohol Use: No     Comment: pt's wife states that he quit drinking in 1997, pt use to drink about 80 beers per week and 900 shots of liquor per week    Review of Systems 10 Systems reviewed and are negative for acute change except as noted in the HPI.    Allergies  Review of patient's allergies indicates no known allergies.  Home Medications   Prior to  Admission medications   Medication Sig Start Date End Date Taking? Authorizing Provider  aspirin 81 MG tablet Take 81 mg by mouth daily.   Yes Historical Provider, MD  donepezil (ARICEPT) 10 MG tablet Take 1/2 tablet tablet daily for 1 week, then increase to 1 tablet daily Patient taking differently: Take 10 mg by mouth daily. midday 11/30/14  Yes Cameron Sprang, MD  simvastatin (ZOCOR) 10 MG tablet Take 1 tablet (10 mg total) by mouth every evening. 09/29/14  Yes Samantha J Rhyne, PA-C  metroNIDAZOLE (FLAGYL) 500 MG tablet Take 1 tablet (500 mg total) by mouth 2 (two) times daily. 01/28/15   Pura Spice, PA-C  sulfamethoxazole-trimethoprim (BACTRIM DS,SEPTRA DS) 800-160 MG per tablet Take 1 tablet by mouth 2 (two) times daily. 01/28/15 02/04/15  Jordan Hawks L Creech, PA-C   BP 136/86 mmHg  Pulse 82  Temp(Src) 98.4 F (36.9 C) (Oral)  Resp 18  Ht 5\' 7"  (1.702 m)  Wt 107 lb (48.535 kg)  BMI 16.75 kg/m2  SpO2 98% Physical Exam  Constitutional: He appears well-developed and well-nourished. No distress.  HENT:  Head: Normocephalic and atraumatic.  Eyes: Conjunctivae and EOM are normal. Right eye exhibits no discharge. Left eye exhibits no discharge.  Cardiovascular: Normal rate, regular rhythm and normal heart sounds.   2+ radial pulses equal bilaterally.  Less than 3 seconds cap refill.  Pulmonary/Chest: Effort normal and breath sounds normal. No respiratory distress. He has no wheezes.  Abdominal: Soft. Bowel sounds are normal. He exhibits no distension. There is no tenderness.  Musculoskeletal:  Full range of motion without tenderness.  Neurological: He is alert. He exhibits normal muscle tone. Coordination normal.  Sensation and strength intact.  Skin: Skin is warm and dry. He is not diaphoretic.  Erythematous swelling to right dorsal hand with extension into right dorsal forearm. Mild redness to right ventral forearm possibly in distribution of vein. Mild tenderness.  Nursing note and  vitals reviewed.   ED Course  Procedures (including critical care time) Labs Review Labs Reviewed  CBC WITH DIFFERENTIAL/PLATELET - Abnormal; Notable for the following:    RBC 3.79 (*)    Hemoglobin 11.2 (*)    HCT 34.8 (*)    Platelets 446 (*)    Neutrophils Relative % 80 (*)    All other components within normal limits  BASIC METABOLIC PANEL    Imaging Review Dg Chest 2 View  01/28/2015   CLINICAL DATA:  64 year old male with a history of cough. History of head and neck carcinoma. History of smoking  EXAM: CHEST - 2 VIEW  COMPARISON:  X-ray 01/28/2013  FINDINGS: Cardiomediastinal silhouette unchanged in size and contour. Atherosclerotic calcifications of the aortic arch.  Interstitial and airspace opacities at the bilateral medial bases, new from the comparison and worst on the right.  Hyperinflation of the lungs with increased AP diameter.  No displaced fracture.  S-shaped scoliotic curvature of the visualized spine, similar to comparison.  IMPRESSION: Interval development of interstitial and airspace opacities at the right greater than left medial lung base, concerning for aspiration pneumonia. Given the patient's history of head and neck carcinoma, repeat imaging once the patient has been treated for this acute process is recommended.  Atherosclerosis.  Signed,  Dulcy Fanny. Earleen Newport, DO  [facility]  [facility]   Electronically Signed   By: Corrie Mckusick D.O.   On: 01/28/2015 13:25     EKG Interpretation None      MDM   Final diagnoses:  Right lower lobe pneumonia  Cellulitis of right lower extremity   Patient with history of soft palate carcinoma in remission for over 10 years presenting with cellulitis of right dorsal hand with rapid spread to right dorsal forearm also with tenderness and a cord to right ventral upper arm. Patient afebrile and denies history of fever, chills, nausea, vomiting. Patient in no acute distress. No  leukocytosis. Patient also has had chronic cough is acutely worse in the past week. Chest x-ray with evidence of possible aspiration pneumonia. Patient started on IV vancomycin for infection. Consult to hospitalist for possible admission. Spoke with Dr. Rockne Menghini who was concerned that patient would have to pay out of pocket for his hospitalization due to the chronic nature of his cough and cellulitis not an indication. Discussed this with the patient and gave the option of outpatient antibiotic therapy with close primary care follow-up or admission. Patient chose outpatient follow-up. He was given additional IV Unasyn to cover aspiration pna. Pt pasted swallowing screen. Patient to follow-up with PCP in 2 days for reevaluation. Also given prescription for outpatient Bactrim and Flagyl.  Discussed return precautions with patient. Discussed all results and patient verbalizes understanding and agrees with plan.  This is a shared patient. This patient was discussed with the physician who saw and evaluated the patient and  agrees with the plan.    Pura Spice, PA-C 01/28/15 1733  Medical screening examination/treatment/procedure(s) were conducted as a shared visit with non-physician practitioner(s) and myself.  I personally evaluated the patient during the encounter.   EKG Interpretation None      Medical screening examination/treatment/procedure(s) were conducted as a shared visit with non-physician practitioner(s) and myself.  I personally evaluated the patient during the encounter.   EKG Interpretation None     Patient is clinically stable. No respiratory distress. Minor cellulitis of right upper extremity noted. He will treated as an outpatient per his request.  Nat Christen, MD 01/29/15 1540

## 2015-01-28 NOTE — ED Notes (Signed)
Pt reports raised reddened areas on legs and arms and hands appearing yesterday. Reddened area on right hand traveling along right forearm. Says he has noticed this along the "big vein on the right upper arm." History of squamous cell CA x16 years ago. Denies fever/chills/nausea/diarrhea. No other symptoms.

## 2015-02-10 ENCOUNTER — Other Ambulatory Visit: Payer: Self-pay | Admitting: Internal Medicine

## 2015-02-10 ENCOUNTER — Ambulatory Visit
Admission: RE | Admit: 2015-02-10 | Discharge: 2015-02-10 | Disposition: A | Discharge status: Home / Self Care | Payer: Medicare Other | Source: Ambulatory Visit | Attending: Internal Medicine | Admitting: Internal Medicine

## 2015-02-10 DIAGNOSIS — R1310 Dysphagia, unspecified: Secondary | ICD-10-CM

## 2015-03-01 ENCOUNTER — Encounter: Payer: Self-pay | Admitting: Neurology

## 2015-03-01 ENCOUNTER — Ambulatory Visit (INDEPENDENT_AMBULATORY_CARE_PROVIDER_SITE_OTHER): Payer: Medicare Other | Admitting: Neurology

## 2015-03-01 VITALS — BP 110/82 | HR 78 | Resp 16 | Ht 66.0 in | Wt 115.0 lb

## 2015-03-01 DIAGNOSIS — F03A0 Unspecified dementia, mild, without behavioral disturbance, psychotic disturbance, mood disturbance, and anxiety: Secondary | ICD-10-CM

## 2015-03-01 DIAGNOSIS — R413 Other amnesia: Secondary | ICD-10-CM

## 2015-03-01 DIAGNOSIS — C051 Malignant neoplasm of soft palate: Secondary | ICD-10-CM

## 2015-03-01 DIAGNOSIS — F0390 Unspecified dementia without behavioral disturbance: Secondary | ICD-10-CM

## 2015-03-01 MED ORDER — DONEPEZIL HCL 10 MG PO TABS: 10.0000 mg | ORAL_TABLET | Freq: Every day | ORAL | Status: DC

## 2015-03-01 NOTE — Patient Instructions (Signed)
1. Continue Aricept 10mg  daily 2. Follow-up in 6 months

## 2015-03-01 NOTE — Progress Notes (Signed)
NEUROLOGY FOLLOW UP OFFICE NOTE  Bryan Arellano [id number]  HISTORY OF PRESENT ILLNESS: I had the pleasure of seeing Zakary Kimura in follow-up in the neurology clinic on 03/01/2015.  The patient was last seen 3 months ago for worsening memory. He is again accompanied by his wife who helps supplement the history today.  Records and images were personally reviewed where available.  I personally reviewed MRI brain with and without contrast which did not show any acute changes. There was mild diffuse atrophy and minimal small vessel change. MMSE on last visit was 20/30. He started Aricept 10mg /day, tolerating the medication without side effects. His wife denies any changes but states that memory problems continue. He does not wander off. She feels he is not as paranoid. He denies any headaches, dizziness, diplopia, focal numbness/tingling/weakness.   HPI: This is a 64 yo RH man with a history of stage III soft palate cancer s/p chemoradiation in 2000, hyperlipidemia, bilateral carotid stenosis L>R, postradiation dysphagia with cervical esophageal narrowing, who presented with worsening memory in 2015. He reports he forgets names but denies other difficulties. His wife reports that he regularly misplaces things at home. He would ask the same questions repeatedly. She denies any difficulties following instructions, no missed medications. He does not drive. His wife has always been in charge of bills. She denies any personality changes, no hallucinations. His father and sister were diagnosed with Alzheimer's disease.  PAST MEDICAL HISTORY: Past Medical History  Diagnosis Date  . Pain around PEG tube site   . Radiation   . Carotid artery occlusion   . Cancer 1999    head and neck  . History of radiation therapy 1999  . Cancer     MEDICATIONS: Current Outpatient Prescriptions on File Prior to Visit  Medication Sig Dispense Refill  . aspirin 81 MG tablet Take 81 mg by mouth daily.    . simvastatin  (ZOCOR) 10 MG tablet Take 1 tablet (10 mg total) by mouth every evening. 30 tablet 0  Aricept 10mg /day No current facility-administered medications on file prior to visit.    ALLERGIES: No Known Allergies  FAMILY HISTORY: Family History  Problem Relation Age of Onset  . Heart disease Sister     before age 72  . Clotting disorder Sister   . Cancer Brother   . Heart disease Brother   . Heart attack Brother     SOCIAL HISTORY: History   Social History  . Marital Status: Married    Spouse Name: N/A  . Number of Children: N/A  . Years of Education: N/A   Occupational History  . Not on file.   Social History Main Topics  . Smoking status: Former Smoker    Types: Cigarettes    Quit date: 04/18/1998  . Smokeless tobacco: Never Used  . Alcohol Use: No     Comment: pt's wife states that he quit drinking in 1997, pt use to drink about 80 beers per week and 900 shots of liquor per week  . Drug Use: No  . Sexual Activity: Not on file   Other Topics Concern  . Not on file   Social History Narrative    REVIEW OF SYSTEMS: Constitutional: No fevers, chills, or sweats, no generalized fatigue, change in appetite Eyes: No visual changes, double vision, eye pain Ear, nose and throat: No hearing loss, ear pain, nasal congestion, sore throat Cardiovascular: No chest pain, palpitations Respiratory:  No shortness of breath at rest or with exertion, wheezes GastrointestinaI:  No nausea, vomiting, diarrhea, abdominal pain, fecal incontinence Genitourinary:  No dysuria, urinary retention or frequency Musculoskeletal:  No neck pain, back pain Integumentary: No rash, pruritus, skin lesions Neurological: as above Psychiatric: No depression, insomnia, anxiety Endocrine: No palpitations, fatigue, diaphoresis, mood swings, change in appetite, change in weight, increased thirst Hematologic/Lymphatic:  No anemia, purpura, petechiae. Allergic/Immunologic: no itchy/runny eyes, nasal congestion,  recent allergic reactions, rashes  PHYSICAL EXAM: Filed Vitals:   03/01/15 0851  BP: 110/82  Pulse: 78  Resp: 16   General: No acute distress Head:  Normocephalic/atraumatic Neck: supple, no paraspinal tenderness, full range of motion Heart:  Regular rate and rhythm Lungs:  Clear to auscultation bilaterally Back: No paraspinal tenderness Skin/Extremities: No rash, no edema Neurological Exam: alert and oriented to person, place, did not know date, initially said month was February then corrected self to March, did not know year or president. No aphasia, +dysarthria. Fund of knowledge is appropriate.  Remote memory intact. 0/3 delayed recall. Attention and concentration are normal.    Able to name objects and repeat phrases. Cranial nerves: Pupils equal, round, reactive to light.  Fundoscopic exam unremarkable, no papilledema. Extraocular movements intact with no nystagmus. Visual fields full. Facial sensation intact. No facial asymmetry. Tongue, uvula, palate midline.  Motor: Bulk and tone normal, muscle strength 5/5 throughout with no pronator drift.  Sensation to light touch intact.  No extinction to double simultaneous stimulation.  Deep tendon reflexes 2+ throughout, toes downgoing.  Finger to nose testing intact.  Gait narrow-based and steady, able to tandem walk adequately.  Romberg negative.  IMPRESSION: This is a 64 yo RH man with a history of stage III soft palate cancer s/p chemoradiation in 2000, hyperlipidemia, bilateral carotid stenosis L>R, postradiation dysphagia with cervical esophageal narrowing, who presented with worsening memory changes. MMSE in December 2015 was 20/30, indicating mild dementia. MRI brain unremarkable. Continue daily Aricept 10mg . He does not drive. Discussed home safety with wife. The importance of physical and brain stimulation exercises, as well as control of vascular risk factors for brain health were discussed. He will follow-up in 6 months.  Thank you  for allowing me to participate in his care.  Please do not hesitate to call for any questions or concerns.  The duration of this appointment visit was 15 minutes of face-to-face time with the patient.  Greater than 50% of this time was spent in counseling, explanation of diagnosis, planning of further management, and coordination of care.   Ellouise Newer, M.D.   CC: Dr. Delfina Redwood

## 2015-03-05 ENCOUNTER — Encounter: Payer: Self-pay | Admitting: Neurology

## 2015-03-05 DIAGNOSIS — C051 Malignant neoplasm of soft palate: Secondary | ICD-10-CM | POA: Insufficient documentation

## 2015-03-05 DIAGNOSIS — F03B18 Unspecified dementia, moderate, with other behavioral disturbance: Secondary | ICD-10-CM | POA: Insufficient documentation

## 2015-03-05 DIAGNOSIS — F0391 Unspecified dementia with behavioral disturbance: Secondary | ICD-10-CM | POA: Insufficient documentation

## 2015-05-04 ENCOUNTER — Other Ambulatory Visit (HOSPITAL_COMMUNITY): Payer: Medicare Other

## 2015-05-04 ENCOUNTER — Ambulatory Visit: Payer: Medicare Other | Admitting: Vascular Surgery

## 2015-05-10 ENCOUNTER — Encounter: Payer: Self-pay | Admitting: Vascular Surgery

## 2015-05-11 ENCOUNTER — Encounter (HOSPITAL_COMMUNITY): Payer: Medicare Other

## 2015-05-11 ENCOUNTER — Ambulatory Visit: Payer: Medicare Other | Admitting: Vascular Surgery

## 2015-09-01 ENCOUNTER — Encounter: Payer: Self-pay | Admitting: Neurology

## 2015-09-01 ENCOUNTER — Ambulatory Visit (INDEPENDENT_AMBULATORY_CARE_PROVIDER_SITE_OTHER): Payer: Medicare Other | Admitting: Neurology

## 2015-09-01 VITALS — BP 110/68 | HR 69 | Wt 111.0 lb

## 2015-09-01 DIAGNOSIS — F03A0 Unspecified dementia, mild, without behavioral disturbance, psychotic disturbance, mood disturbance, and anxiety: Secondary | ICD-10-CM

## 2015-09-01 DIAGNOSIS — F0390 Unspecified dementia without behavioral disturbance: Secondary | ICD-10-CM | POA: Diagnosis not present

## 2015-09-01 DIAGNOSIS — R413 Other amnesia: Secondary | ICD-10-CM

## 2015-09-01 DIAGNOSIS — C051 Malignant neoplasm of soft palate: Secondary | ICD-10-CM | POA: Diagnosis not present

## 2015-09-01 MED ORDER — DONEPEZIL HCL 10 MG PO TABS: 10.0000 mg | ORAL_TABLET | Freq: Every day | ORAL | Status: DC

## 2015-09-01 NOTE — Progress Notes (Signed)
Sent to PCP ?

## 2015-09-01 NOTE — Patient Instructions (Signed)
1. Continue Donepezil '10mg'$  daily 2. Continue brain stimulation exercises (word search) and physical exercise for brain health 3. Get into a routine to help remember things 4. Follow-up in 6 months

## 2015-09-01 NOTE — Progress Notes (Addendum)
NEUROLOGY FOLLOW UP OFFICE NOTE  Bryan Arellano [id number]  HISTORY OF PRESENT ILLNESS: I had the pleasure of seeing Bryan Arellano in follow-up in the neurology clinic on 09/01/2015.  The patient was last seen 6 months ago for mild dementia. He is again accompanied by his wife who helps supplement the history today. He is taking Aricept '10mg'$  daily without side effects. He feels his memory is unchanged. His wife reports he still forgets simple things more so than before. She has to administer his medications, otherwise he drinks his water and forgets the pill on the table. No difficulties with ADLs. He denies any headaches, dizziness, diplopia, focal numbness/tingling/weakness.   HPI: This is a 64 yo RH man with a history of stage III soft palate cancer s/p chemoradiation in 2000, hyperlipidemia, bilateral carotid stenosis L>R, postradiation dysphagia with cervical esophageal narrowing, who presented with worsening memory in 2015. He reports he forgets names but denies other difficulties. His wife reports that he regularly misplaces things at home. He would ask the same questions repeatedly. She denies any difficulties following instructions, no missed medications. He does not drive. His wife has always been in charge of bills. She denies any personality changes, no hallucinations. His father and sister were diagnosed with Alzheimer's disease.  Diagnostic Data: MRI brain with and without contrast 11/2014 showed mild diffuse atrophy, minimal chronic microvascular disease, no acute changes.  PAST MEDICAL HISTORY: Past Medical History  Diagnosis Date  . Pain around PEG tube site   . Radiation   . Carotid artery occlusion   . Cancer 1999    head and neck  . History of radiation therapy 1999  . Cancer     MEDICATIONS: Current Outpatient Prescriptions on File Prior to Visit  Medication Sig Dispense Refill  . aspirin 81 MG tablet Take 81 mg by mouth daily.    Marland Kitchen donepezil (ARICEPT) 10 MG tablet  Take 1 tablet (10 mg total) by mouth daily. midday 90 tablet 3  . simvastatin (ZOCOR) 10 MG tablet Take 1 tablet (10 mg total) by mouth every evening. 30 tablet 0   No current facility-administered medications on file prior to visit.    ALLERGIES: No Known Allergies  FAMILY HISTORY: Family History  Problem Relation Age of Onset  . Heart disease Sister     before age 68  . Clotting disorder Sister   . Cancer Brother   . Heart disease Brother   . Heart attack Brother     SOCIAL HISTORY: Social History   Social History  . Marital Status: Married    Spouse Name: N/A  . Number of Children: N/A  . Years of Education: N/A   Occupational History  . Not on file.   Social History Main Topics  . Smoking status: Former Smoker    Types: Cigarettes    Quit date: 04/18/1998  . Smokeless tobacco: Never Used  . Alcohol Use: No     Comment: pt's wife states that he quit drinking in 1997, pt use to drink about 80 beers per week and 900 shots of liquor per week  . Drug Use: No  . Sexual Activity: Not on file   Other Topics Concern  . Not on file   Social History Narrative    REVIEW OF SYSTEMS: Constitutional: No fevers, chills, or sweats, no generalized fatigue, change in appetite Eyes: No visual changes, double vision, eye pain Ear, nose and throat: No hearing loss, ear pain, nasal congestion, sore throat Cardiovascular: No chest  pain, palpitations Respiratory:  No shortness of breath at rest or with exertion, wheezes GastrointestinaI: No nausea, vomiting, diarrhea, abdominal pain, fecal incontinence Genitourinary:  No dysuria, urinary retention or frequency Musculoskeletal:  No neck pain, back pain Integumentary: No rash, pruritus, skin lesions Neurological: as above Psychiatric: No depression, insomnia, anxiety Endocrine: No palpitations, fatigue, diaphoresis, mood swings, change in appetite, change in weight, increased thirst Hematologic/Lymphatic:  No anemia, purpura,  petechiae. Allergic/Immunologic: no itchy/runny eyes, nasal congestion, recent allergic reactions, rashes  PHYSICAL EXAM: Filed Vitals:   09/01/15 0855  BP: 110/68  Pulse: 69   General: No acute distress Head:  Normocephalic/atraumatic Neck: supple, no paraspinal tenderness, full range of motion Heart:  Regular rate and rhythm Lungs:  Clear to auscultation bilaterally Back: No paraspinal tenderness Skin/Extremities: No rash, no edema Neurological Exam: alert and oriented to person, place, did not know date but able to choose correct option when given multiple choices. No aphasia, + moderate dysarthria (unchanged). Fund of knowledge is appropriate. Remote memory intact. 0/3 delayed recall. Attention and concentration are normal. Able to name objects and repeat phrases. Clock drawing 5/5 MMSE - Mini Mental State Exam 09/01/2015 11/30/2014  Orientation to time 0 1  Orientation to Place 2 3  Registration 3 3  Attention/ Calculation 5 4  Recall 0 0  Language- name 2 objects 2 2  Language- repeat 1 1  Language- follow 3 step command 3 3  Language- read & follow direction 1 1  Write a sentence 1 1  Copy design 1 1  Total score 19 20   Cranial nerves: Pupils equal, round, reactive to light. Fundoscopic exam unremarkable, no papilledema. Extraocular movements intact with no nystagmus. Visual fields full. Facial sensation intact. No facial asymmetry. Tongue, uvula, palate midline. Motor: Bulk and tone normal, muscle strength 5/5 throughout with no pronator drift. Sensation to light touch intact. No extinction to double simultaneous stimulation. Deep tendon reflexes 2+ throughout, toes downgoing. Finger to nose testing intact. Gait narrow-based and steady, able to tandem walk adequately. Romberg negative.  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 MMSE today is 19/30 (20/30 in December 2015), indicating mild dementia. MRI brain unremarkable. Continue daily Aricept '10mg'$ . He does not drive. Findings were again discussed with the patient and his wife, the importance of physical and brain stimulation exercises, as well as control of vascular risk factors for brain health were also discussed. We discussed different memory strategies such as getting into a routine and using a pillbox and calendar. He will follow-up in 6 months and knows to call our office for any changes.  Thank you for allowing me to participate in his care.  Please do not hesitate to call for any questions or concerns.  The duration of this appointment visit was 24 minutes of face-to-face time with the patient.  Greater than 50% of this time was spent in counseling, explanation of diagnosis, planning of further management, and coordination of care.   Ellouise Newer, M.D.   CC: Dr. Delfina Redwood

## 2015-10-16 ENCOUNTER — Telehealth: Payer: Self-pay | Admitting: Neurology

## 2015-10-16 NOTE — Telephone Encounter (Signed)
PT called and needs a refill of there medication donepezil called in/Dawn CB# [phone]

## 2015-10-16 NOTE — Telephone Encounter (Signed)
Rx sent in for patient on 9/2 with 11 refills. Pharmacy does have Rx on file. I advised to call pharmacy for refill to be processed.

## 2016-03-01 ENCOUNTER — Ambulatory Visit: Payer: Medicare Other | Admitting: Neurology

## 2016-03-18 ENCOUNTER — Ambulatory Visit: Payer: Medicare Other | Admitting: Neurology

## 2016-03-19 ENCOUNTER — Ambulatory Visit (INDEPENDENT_AMBULATORY_CARE_PROVIDER_SITE_OTHER): Payer: Medicare Other | Admitting: Neurology

## 2016-03-19 ENCOUNTER — Encounter: Payer: Self-pay | Admitting: Neurology

## 2016-03-19 VITALS — BP 100/64 | HR 82 | Ht 66.0 in | Wt 119.2 lb

## 2016-03-19 DIAGNOSIS — F0390 Unspecified dementia without behavioral disturbance: Secondary | ICD-10-CM

## 2016-03-19 DIAGNOSIS — R413 Other amnesia: Secondary | ICD-10-CM

## 2016-03-19 DIAGNOSIS — F03A0 Unspecified dementia, mild, without behavioral disturbance, psychotic disturbance, mood disturbance, and anxiety: Secondary | ICD-10-CM

## 2016-03-19 MED ORDER — DONEPEZIL HCL 10 MG PO TABS: 10.0000 mg | ORAL_TABLET | Freq: Every day | ORAL | Status: DC

## 2016-03-19 NOTE — Progress Notes (Signed)
NEUROLOGY FOLLOW UP OFFICE NOTE  [name] [id number]  HISTORY OF PRESENT ILLNESS: I had the pleasure of seeing Bryan Arellano in follow-up in the neurology clinic on 03/19/2016.  The patient was last seen 6 months ago for mild dementia. He is again accompanied by his wife who helps supplement the history today. MMSE in September 2016 was 19/30. He is taking Aricept '10mg'$  daily without side effects. He feels his memory is good. His wife reports it is "so-so," he loses things, for instance they bought clothes in January and only found them yesterday. He has a stack of pens in his left shirt pocket and a bunch of keys hanging from his belt, his wife reports he has always collected keys since they got married, she reports he has nothing else to do and collects the pens. Otherwise she denies any hallucinations or paranoia, he sometimes has attitude and threw something at her one time. He denies any falls, no headaches, dizziness, diplopia, focal numbness/tingling/weakness. No difficulties with ADLs.  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 He reports he forgets names but denies other difficulties. His wife reports that he regularly misplaces things at home. He would ask the same questions repeatedly. She denies any difficulties following instructions, no missed medications. He does not drive. His wife has always been in charge of bills. She denies any personality changes, no hallucinations. His father and sister were diagnosed with Alzheimer's disease.  Diagnostic Data: MRI brain with and without contrast 11/2014 showed mild diffuse atrophy, minimal chronic microvascular disease, no acute changes.  PAST MEDICAL HISTORY: Past Medical History  Diagnosis Date  . Pain around PEG tube site   .  Radiation   . Carotid artery occlusion   . Cancer 1999    head and neck  . History of radiation therapy 1999  . Cancer     MEDICATIONS: Current Outpatient Prescriptions on File Prior to Visit  Medication Sig Dispense Refill  . aspirin 81 MG tablet Take 81 mg by mouth daily.    Marland Kitchen donepezil (ARICEPT) 10 MG tablet Take 1 tablet (10 mg total) by mouth daily. midday 30 tablet 11  . simvastatin (ZOCOR) 10 MG tablet Take 1 tablet (10 mg total) by mouth every evening. 30 tablet 0   No current facility-administered medications on file prior to visit.    ALLERGIES: No Known Allergies  FAMILY HISTORY: Family History  Problem Relation Age of Onset  . Heart disease Sister     before age 7  . Clotting disorder Sister   . Cancer Brother   . Heart disease Brother   . Heart attack Brother     SOCIAL HISTORY: Social History   Social History  . Marital Status: Married    Spouse Name: N/A  . Number of Children: N/A  . Years of Education: N/A   Occupational History  . Not on file.   Social History Main Topics  . Smoking status: Former Smoker    Types: Cigarettes    Quit date: 04/18/1998  . Smokeless tobacco: Never Used  . Alcohol Use: No     Comment: pt's wife states that he quit drinking in 1997, pt use to drink about 80 beers per week and 900 shots of liquor per week  . Drug Use: No  . Sexual Activity:  Not on file   Other Topics Concern  . Not on file   Social History Narrative    REVIEW OF SYSTEMS: Constitutional: No fevers, chills, or sweats, no generalized fatigue, change in appetite Eyes: No visual changes, double vision, eye pain Ear, nose and throat: No hearing loss, ear pain, nasal congestion, sore throat Cardiovascular: No chest pain, palpitations Respiratory:  No shortness of breath at rest or with exertion, wheezes GastrointestinaI: No nausea, vomiting, diarrhea, abdominal pain, fecal incontinence Genitourinary:  No dysuria, urinary retention or  frequency Musculoskeletal:  No neck pain, back pain Integumentary: No rash, pruritus, skin lesions Neurological: as above Psychiatric: No depression, insomnia, anxiety Endocrine: No palpitations, fatigue, diaphoresis, mood swings, change in appetite, change in weight, increased thirst Hematologic/Lymphatic:  No anemia, purpura, petechiae. Allergic/Immunologic: no itchy/runny eyes, nasal congestion, recent allergic reactions, rashes  PHYSICAL EXAM: Filed Vitals:   03/19/16 1146  BP: 100/64  Pulse: 82   General: No acute distress Head:  Normocephalic/atraumatic Neck: supple, no paraspinal tenderness, full range of motion Heart:  Regular rate and rhythm Lungs:  Clear to auscultation bilaterally Back: No paraspinal tenderness Skin/Extremities: No rash, no edema Neurological Exam: alert and oriented to person, place, day of week. No aphasia, + moderate dysarthria (unchanged). Fund of knowledge is appropriate. Remote memory intact. 0/3 delayed recall. Attention and concentration are normal. Able to name objects and repeat phrases. Clock drawing 5/5 MMSE - Mini Mental State Exam 03/19/2016 09/01/2015 11/30/2014  Orientation to time 1 0 1  Orientation to Place '4 2 3  '$ Registration '3 3 3  '$ Attention/ Calculation '5 5 4  '$ Recall 0 0 0  Language- name 2 objects '2 2 2  '$ Language- repeat '1 1 1  '$ Language- follow 3 step command '3 3 3  '$ Language- read & follow direction '1 1 1  '$ Write a sentence '1 1 1  '$ Copy design '1 1 1  '$ Total score '22 19 20   '$ Cranial nerves: Pupils equal, round, reactive to light. Extraocular movements intact with no nystagmus. Visual fields full. Facial sensation intact. No facial asymmetry. Tongue, uvula, palate midline. Motor: Bulk and tone normal, muscle strength 5/5 throughout with no pronator drift. Sensation to light touch intact. No extinction to double simultaneous stimulation. Deep tendon reflexes 2+ throughout, toes downgoing. Finger to nose testing intact. Gait  narrow-based and steady, able to tandem walk adequately. Romberg negative.  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 MMSE today is 22/30 (previously 19/30 in September 2016, 20/30 in December 2015), indicating mild dementia. Neurological exam non-focal except for moderate dysarthria. MRI brain unremarkable. Continue daily Aricept '10mg'$ . He does not drive. We again discussed the importance of physical and brain stimulation exercises, as well as control of vascular risk factors for brain health were also discussed. He will follow-up in 1 year and knows to call our office for any changes.  Thank you for allowing me to participate in his care.  Please do not hesitate to call for any questions or concerns.  The duration of this appointment visit was 25 minutes of face-to-face time with the patient.  Greater than 50% of this time was spent in counseling, explanation of diagnosis, planning of further management, and coordination of care.   Ellouise Newer, M.D.   CC: Dr. Delfina Redwood

## 2016-03-19 NOTE — Patient Instructions (Signed)
1. Continue Aricept '10mg'$  daily 2. Physical exercise and brain stimulation exercises are important for brain health 3. Follow-up in 1 year

## 2016-04-29 ENCOUNTER — Other Ambulatory Visit: Payer: Self-pay | Admitting: *Deleted

## 2016-04-29 DIAGNOSIS — I779 Disorder of arteries and arterioles, unspecified: Secondary | ICD-10-CM

## 2016-04-29 DIAGNOSIS — I739 Peripheral vascular disease, unspecified: Principal | ICD-10-CM

## 2016-05-31 ENCOUNTER — Encounter: Payer: Self-pay | Admitting: Neurology

## 2016-06-13 ENCOUNTER — Encounter: Payer: Self-pay | Admitting: Vascular Surgery

## 2016-06-14 ENCOUNTER — Ambulatory Visit (HOSPITAL_COMMUNITY)
Admission: RE | Admit: 2016-06-14 | Discharge: 2016-06-14 | Disposition: A | Discharge status: Home / Self Care | Payer: Medicare Other | Source: Ambulatory Visit | Attending: Vascular Surgery | Admitting: Vascular Surgery

## 2016-06-14 DIAGNOSIS — I6523 Occlusion and stenosis of bilateral carotid arteries: Secondary | ICD-10-CM | POA: Diagnosis not present

## 2016-06-14 DIAGNOSIS — I739 Peripheral vascular disease, unspecified: Secondary | ICD-10-CM

## 2016-06-14 DIAGNOSIS — I779 Disorder of arteries and arterioles, unspecified: Secondary | ICD-10-CM

## 2016-06-20 ENCOUNTER — Encounter: Payer: Self-pay | Admitting: Vascular Surgery

## 2016-06-20 ENCOUNTER — Ambulatory Visit (INDEPENDENT_AMBULATORY_CARE_PROVIDER_SITE_OTHER): Payer: Medicare Other | Admitting: Vascular Surgery

## 2016-06-20 VITALS — BP 113/75 | HR 80 | Temp 97.9°F | Resp 14 | Ht 67.0 in | Wt 115.0 lb

## 2016-06-20 DIAGNOSIS — I6521 Occlusion and stenosis of right carotid artery: Secondary | ICD-10-CM

## 2016-06-20 NOTE — Progress Notes (Signed)
HISTORY AND PHYSICAL     CC:  Follow up carotid duplex scan  Referring Provider:  Kandice Hams, MD  HPI: This is a 65 y.o. male who has known carotid stenosis is here for f/u carotid duplex scan.  Denies amaurosis fugax, paresthesias, or hemiparesis.  He was offered carotid angiogram to further evaluate his carotid stenosis and he refused several years ago. He is concerned that he would have a stroke from the procedure.  His carotid stenosis was found on carotid angiogram when he was evaluated for his left neck mass.  He does have a hx of prior head and neck cancer with radiation tx and he is followed at [facility] by Dr. Owens Shark.  He has remote tobacco hx and quit in 1999.  He did develop some swallowing problems from the radiation and did require a feeding tube and this has since been removed.   He is on a statin for hypercholesterolemia.  He is also on aspirin antiplatelet therapay.     Past Medical History  Diagnosis Date  . Pain around PEG tube site   . Radiation   . Carotid artery occlusion   . Cancer ([place]) 1999    head and neck  . History of radiation therapy 1999  . Cancer [facility])     Past Surgical History   Procedure  Laterality  Date   .  Gastrostomy tube placement    2013       TUBE LATER REMOVED in 2013     No Known Allergies   Current Outpatient Prescriptions on File Prior to Visit  Medication Sig Dispense Refill  . aspirin 81 MG tablet Take 81 mg by mouth daily.    Marland Kitchen donepezil (ARICEPT) 10 MG tablet Take 1 tablet (10 mg total) by mouth daily. midday 30 tablet 11  . simvastatin (ZOCOR) 10 MG tablet Take 1 tablet (10 mg total) by mouth every evening. 30 tablet 0   No current facility-administered medications on file prior to visit.    Pt's meds include: Statin:  Yes.   Beta Blocker:  No. Aspirin: Yes.   Other antiplatelets/anticoagulants:  No.     Family History   Problem  Relation  Age of Onset   .  Heart disease  Sister         before age 87   .   Clotting disorder  Sister     .  Cancer  Brother     .  Heart disease  Brother     .  Heart attack  Brother         History      Social History   .  Marital Status:  Married       Spouse Name:  N/A       Number of Children:  N/A   .  Years of Education:  N/A      Occupational History   .  Not on file.      Social History Main Topics   .  Smoking status:  Former Smoker       Types:  Cigarettes       Quit date:  04/18/1998   .  Smokeless tobacco:  Never Used   .  Alcohol Use:  No         Comment: pt's wife states that he quit drinking in 1997, pt use to drink about 80 beers per week and 900 shots of liquor per week   .  Drug  Use:  No   .  Sexual Activity:  Not on file      Other Topics  Concern   .  Not on file      Social History Narrative   .  No narrative on file      ROS: '[x]'$  Positive   '[ ]'$  Negative   '[ ]'$  All sytems reviewed and are negative  Cardiovascular: '[]'$  chest pain/pressure '[]'$  palpitations '[]'$  SOB lying flat '[]'$  DOE '[]'$  pain in legs while walking '[]'$  pain in feet when lying flat '[]'$  hx of DVT '[]'$  hx of phlebitis '[]'$  swelling in legs '[]'$  varicose veins  Pulmonary: '[]'$  productive cough '[]'$  asthma '[]'$  wheezing  Neurologic: '[]'$  weakness in '[]'$  arms '[]'$  legs '[]'$  numbness in '[]'$  arms '[]'$  legs '[]'$ difficulty speaking or slurred speech '[]'$  temporary loss of vision in one eye '[]'$  dizziness  Hematologic: '[]'$  bleeding problems '[]'$  problems with blood clotting easily  GI '[]'$  vomiting blood '[]'$  blood in stool  GU: '[]'$  burning with urination '[]'$  blood in urine  Psychiatric: '[]'$  hx of major depression  Integumentary: '[]'$  rashes '[]'$  ulcers  Constitutional: '[]'$  fever '[]'$  chills   PHYSICAL EXAMINATION:    Filed Vitals:   06/20/16 0844 06/20/16 0846  BP: 114/80 113/75  Pulse: 80 80  Temp: 97.9 F (36.6 C)   TempSrc: Oral   Resp: 14   Height: '5\' 7"'$  (1.702 m)   Weight: 115 lb (52.164 kg)   SpO2: 97%     General:  Thin male in NAD Gait: Normal HENT: WNL;  normocephalic, minimal skin changes from radiation Pulmonary: normal non-labored breathing , without Rales, rhonchi,  wheezing Cardiac: RRR, without  Murmurs, rubs or gallops; without carotid bruits Abdomen: soft, NT, no masses Skin: without rashes,  ulcers  Extremities: without ischemic changes, without Gangrene , without cellulitis; without open wounds;             Neurologic: A&O X 3; Appropriate Affect ; SENSATION: normal; MOTOR FUNCTION:  moving all extremities equally. Speech is fluent/normal   Non-Invasive Vascular Imaging: Carotid Duplex Scan:  Still a high-grade right internal carotid artery stenosis unchanged over the last 2 years   ASSESSMENT: 65 year old male with asymptomatic high-grade right internal carotid artery stenosis probably secondary to atherosclerosis and radiation. He remains asymptomatic. His duplex is unchanged over the last 2 years. I discussed with the patient today the possibility of carotid antrum to further evaluate the stenosis and consider repair of this. However at this point he continues to wish for observation only. He will follow-up with [place] with repeat carotid duplex exam in 6 months.  PLAN: See above  Ruta Hinds, MD [facility] [place] Office: [phone] Pager: [phone]

## 2016-11-06 NOTE — Addendum Note (Signed)
Addended by: Mena Goes on: 11/06/2016 04:29 PM   Modules accepted: Orders

## 2016-12-20 ENCOUNTER — Encounter: Payer: Self-pay | Admitting: Vascular Surgery

## 2016-12-26 ENCOUNTER — Ambulatory Visit: Payer: Medicare Other | Admitting: Vascular Surgery

## 2016-12-26 ENCOUNTER — Encounter (HOSPITAL_COMMUNITY): Payer: Medicare Other

## 2017-01-02 ENCOUNTER — Ambulatory Visit (INDEPENDENT_AMBULATORY_CARE_PROVIDER_SITE_OTHER): Payer: Medicare Other | Admitting: Vascular Surgery

## 2017-01-02 ENCOUNTER — Encounter: Payer: Self-pay | Admitting: Vascular Surgery

## 2017-01-02 ENCOUNTER — Ambulatory Visit (HOSPITAL_COMMUNITY)
Admission: RE | Admit: 2017-01-02 | Discharge: 2017-01-02 | Disposition: A | Discharge status: Home / Self Care | Payer: Medicare Other | Source: Ambulatory Visit | Attending: Vascular Surgery | Admitting: Vascular Surgery

## 2017-01-02 VITALS — BP 134/82 | HR 73 | Temp 97.2°F | Resp 20 | Ht 66.5 in | Wt 125.5 lb

## 2017-01-02 DIAGNOSIS — I6521 Occlusion and stenosis of right carotid artery: Secondary | ICD-10-CM | POA: Diagnosis not present

## 2017-01-02 DIAGNOSIS — I6523 Occlusion and stenosis of bilateral carotid arteries: Secondary | ICD-10-CM | POA: Insufficient documentation

## 2017-01-02 LAB — VAS US CAROTID
LCCADDIAS: 25 cm/s
LCCADSYS: 69 cm/s
LICADDIAS: 42 cm/s
LICADSYS: 106 cm/s
LICAPDIAS: 31 cm/s
LICAPSYS: 95 cm/s
Left CCA prox dias: 22 cm/s
Left CCA prox sys: 57 cm/s
RCCAPDIAS: 15 cm/s
RCCAPSYS: 50 cm/s
RIGHT CCA MID DIAS: 18 cm/s
RIGHT ECA DIAS: -163 cm/s
Right cca dist sys: -74 cm/s

## 2017-01-02 NOTE — Progress Notes (Signed)
HISTORY AND PHYSICAL        CC:  Follow up carotid duplex scan   Referring Provider:  Kandice Hams, MD   HPI: This is a 66 y.o. male who has known carotid stenosis is here for f/u carotid duplex scan.  Denies amaurosis fugax, paresthesias, or hemiparesis.  He was offered carotid angiogram to further evaluate his carotid stenosis and he refused several years ago. He is concerned that he would have a stroke from the procedure.  His carotid stenosis was found on carotid angiogram when he was evaluated for his left neck mass.  He does have a hx of prior head and neck cancer with radiation tx and he is followed at [facility] by Dr. Owens Shark.  He has remote tobacco hx and quit in 1999.  He did develop some swallowing problems from the radiation and did require a feeding tube and this has since been removed.   He is on a statin for hypercholesterolemia.  He is also on aspirin antiplatelet therapay.          Past Medical History  Diagnosis Date  . Pain around PEG tube site    . Radiation    . Carotid artery occlusion    . Cancer ([place]) 1999      head and neck  . History of radiation therapy 1999  . Cancer [facility])              Past Surgical History   Procedure  Laterality  Date   .  Gastrostomy tube placement    2013       TUBE LATER REMOVED in 2013       No Known Allergies         Current Outpatient Prescriptions on File Prior to Visit  Medication Sig Dispense Refill  . aspirin 81 MG tablet Take 81 mg by mouth daily.      Marland Kitchen donepezil (ARICEPT) 10 MG tablet Take 1 tablet (10 mg total) by mouth daily. midday 30 tablet 11  . simvastatin (ZOCOR) 10 MG tablet Take 1 tablet (10 mg total) by mouth every evening. 30 tablet 0    No current facility-administered medications on file prior to visit.      Pt's meds include: Statin:  Yes.   Beta Blocker:  No. Aspirin: Yes.   Other antiplatelets/anticoagulants:  No.           Family History   Problem  Relation  Age of Onset   .  Heart  disease  Sister         before age 41   .  Clotting disorder  Sister     .  Cancer  Brother     .  Heart disease  Brother     .  Heart attack  Brother         History          Social History   .  Marital Status:  Married       Spouse Name:  N/A       Number of Children:  N/A   .  Years of Education:  N/A        Occupational History   .  Not on file.           Social History Main Topics   .  Smoking status:  Former Smoker       Types:  Cigarettes       Quit date:  04/18/1998   .  Smokeless tobacco:  Never Used   .  Alcohol Use:  No         Comment: pt's wife states that he quit drinking in 1997, pt use to drink about 80 beers per week and 900 shots of liquor per week   .  Drug Use:  No   .  Sexual Activity:  Not on file         Other Topics  Concern   .  Not on file        Social History Narrative   .  No narrative on file         ROS: '[x]'$  Positive   '[ ]'$  Negative   '[ ]'$  All sytems reviewed and are negative   Cardiovascular: '[]'$  chest pain/pressure '[]'$  palpitations '[]'$  SOB lying flat '[]'$  DOE '[]'$  pain in legs while walking '[]'$  pain in feet when lying flat '[]'$  hx of DVT '[]'$  hx of phlebitis '[]'$  swelling in legs '[]'$  varicose veins   Pulmonary: '[]'$  productive cough '[]'$  asthma '[]'$  wheezing   Neurologic: '[]'$  weakness in '[]'$  arms '[]'$  legs '[]'$  numbness in '[]'$  arms '[]'$  legs '[]'$ difficulty speaking or slurred speech '[]'$  temporary loss of vision in one eye '[]'$  dizziness   Hematologic: '[]'$  bleeding problems '[]'$  problems with blood clotting easily   GI '[]'$  vomiting blood '[]'$  blood in stool   GU: '[]'$  burning with urination '[]'$  blood in urine   Psychiatric: '[]'$  hx of major depression   Integumentary: '[]'$  rashes '[]'$  ulcers   Constitutional: '[]'$  fever '[]'$  chills     PHYSICAL EXAMINATION:  Vitals:   01/02/17 1137 01/02/17 1139  BP: 129/83 134/82  Pulse: 73   Resp: 20   Temp: 97.2 F (36.2 C)   TempSrc: Oral   SpO2: 100%   Weight: 125 lb 8 oz (56.9 kg)   Height: 5' 6.5"  (1.689 m)      General:  Thin male in NAD Gait: Normal HENT: WNL; normocephalic, minimal skin changes from radiation Pulmonary: normal non-labored breathing , without Rales, rhonchi,  wheezing Cardiac: RRR, without  Murmurs, rubs or gallops; without carotid bruits Abdomen: soft, NT, no masses Skin: without rashes,  ulcers  Extremities: without ischemic changes, without Gangrene , without cellulitis; without open wounds;             Neurologic: A&O X 3; Appropriate Affect ; SENSATION: normal; MOTOR FUNCTION:  moving all extremities equally. Speech is fluent/normal     Non-Invasive Vascular Imaging: Carotid Duplex Scan:  Still a high-grade right internal carotid artery stenosis unchanged over the last 2.5 years.  Velocities today 372/121 versus 369/125 6 months ago.  No significant left side stenosis.     ASSESSMENT: 66 year old male with asymptomatic high-grade right internal carotid artery stenosis probably secondary to atherosclerosis and radiation. He remains asymptomatic. His duplex is unchanged over the last 2.5 years. I discussed with the patient today the possibility of carotid arteriogram to further evaluate the stenosis and consider repair of this. However at this point he continues to wish for observation only. He will follow-up with [place] with repeat carotid duplex exam in 6 months.  He will return sooner if he develops symptoms.   PLAN: See above   Ruta Hinds, MD [facility] [place] Office: [phone] Pager: [phone]

## 2017-01-06 ENCOUNTER — Other Ambulatory Visit: Payer: Self-pay

## 2017-01-06 DIAGNOSIS — I6521 Occlusion and stenosis of right carotid artery: Secondary | ICD-10-CM

## 2017-03-24 ENCOUNTER — Encounter: Payer: Self-pay | Admitting: Neurology

## 2017-03-24 ENCOUNTER — Ambulatory Visit (INDEPENDENT_AMBULATORY_CARE_PROVIDER_SITE_OTHER): Payer: Medicare Other | Admitting: Neurology

## 2017-03-24 VITALS — BP 130/84 | HR 69 | Temp 97.9°F | Resp 16 | Ht 65.0 in | Wt 119.8 lb

## 2017-03-24 DIAGNOSIS — F0390 Unspecified dementia without behavioral disturbance: Secondary | ICD-10-CM | POA: Diagnosis not present

## 2017-03-24 DIAGNOSIS — F03A0 Unspecified dementia, mild, without behavioral disturbance, psychotic disturbance, mood disturbance, and anxiety: Secondary | ICD-10-CM

## 2017-03-24 DIAGNOSIS — R413 Other amnesia: Secondary | ICD-10-CM

## 2017-03-24 MED ORDER — DONEPEZIL HCL 10 MG PO TABS: 10.0000 mg | ORAL_TABLET | Freq: Every day | ORAL | 3 refills | Status: DC

## 2017-03-24 NOTE — Progress Notes (Signed)
NEUROLOGY FOLLOW UP OFFICE NOTE  Bryan Arellano [id number]  HISTORY OF PRESENT ILLNESS: I had the pleasure of seeing Bryan Arellano in follow-up in the neurology clinic on 03/24/2017.  The patient was last seen a year ago for mild dementia. He is again accompanied by his wife who helps supplement the history today. MMSE in March 2017 was 22/30 (19/30 in September 2016). He is taking Aricept '10mg'$  daily without side effects. He feels his memory is okay. His wife reports his memory is "gone." he does not recall conversations from a few minutes ago, he states "I don't need to remember." He is able to bathe and dress independently, but his wife reports that if she does not pick out his clothes, he would wear the same clothes everyday. He would not bathe if she did not remind him. He would argue and fuss with her more, especially when she tries to explain that he is not able to do something anymore. He has some mood swings, they would be laughing and talking one time, then he gets upset another. She asks him the name of his sister who passed away, he does not recall she passed away last month. His wife states the sister had dementia for 4-5 years. She denies any hallucinations or paranoia, no wandering behavior. He denies any falls, no headaches, dizziness, diplopia, focal numbness/tingling/weakness. This morning he was reporting a knot on the left side of his neck to his wife.   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 He reports he forgets names but denies other difficulties. His wife reports that he regularly misplaces things at home. He would ask the same questions repeatedly. She denies any difficulties following instructions, no missed medications. He does not drive. His wife has always been in  charge of bills. She denies any personality changes, no hallucinations. His father and sister were diagnosed with Alzheimer's disease.  Diagnostic Data: MRI brain with and without contrast 11/2014 showed mild diffuse atrophy, minimal chronic microvascular disease, no acute changes.  PAST MEDICAL HISTORY: Past Medical History:  Diagnosis Date  . Cancer ([place]) 1999   head and neck  . Cancer ([place])   . Carotid artery occlusion   . History of radiation therapy 1999  . Pain around PEG tube site   . Radiation     MEDICATIONS: Current Outpatient Prescriptions on File Prior to Visit  Medication Sig Dispense Refill  . aspirin 81 MG tablet Take 81 mg by mouth daily.    Marland Kitchen donepezil (ARICEPT) 10 MG tablet Take 1 tablet (10 mg total) by mouth daily. midday 30 tablet 11  . simvastatin (ZOCOR) 10 MG tablet Take 1 tablet (10 mg total) by mouth every evening. 30 tablet 0   No current facility-administered medications on file prior to visit.     ALLERGIES: No Known Allergies  FAMILY HISTORY: Family History  Problem Relation Age of Onset  . Heart disease Sister     before age 78  . Clotting disorder Sister   . Cancer Brother   . Heart disease Brother   . Heart attack Brother     SOCIAL HISTORY: Social History   Social History  . Marital status: Married    Spouse name: N/A  . Number of children: N/A  . Years of education: N/A   Occupational  History  . Not on file.   Social History Main Topics  . Smoking status: Former Smoker    Types: Cigarettes    Quit date: 04/18/1998  . Smokeless tobacco: Never Used  . Alcohol use No     Comment: pt's wife states that he quit drinking in 1997, pt use to drink about 80 beers per week and 900 shots of liquor per week  . Drug use: No  . Sexual activity: Not on file   Other Topics Concern  . Not on file   Social History Narrative  . No narrative on file    REVIEW OF SYSTEMS: Constitutional: No fevers, chills, or sweats, no generalized  fatigue, change in appetite Eyes: No visual changes, double vision, eye pain Ear, nose and throat: No hearing loss, ear pain, nasal congestion, sore throat Cardiovascular: No chest pain, palpitations Respiratory:  No shortness of breath at rest or with exertion, wheezes GastrointestinaI: No nausea, vomiting, diarrhea, abdominal pain, fecal incontinence Genitourinary:  No dysuria, urinary retention or frequency Musculoskeletal:  +neck pain, no back pain Integumentary: No rash, pruritus, skin lesions Neurological: as above Psychiatric: No depression, insomnia, anxiety Endocrine: No palpitations, fatigue, diaphoresis, mood swings, change in appetite, change in weight, increased thirst Hematologic/Lymphatic:  No anemia, purpura, petechiae. Allergic/Immunologic: no itchy/runny eyes, nasal congestion, recent allergic reactions, rashes  PHYSICAL EXAM: Vitals:   03/24/17 0832  BP: 130/84  Pulse: 69  Resp: 16  Temp: 97.9 F (36.6 C)   General: No acute distress Head:  Normocephalic/atraumatic Neck: supple, no paraspinal tenderness, full range of motion Heart:  Regular rate and rhythm Lungs:  Clear to auscultation bilaterally Back: No paraspinal tenderness Skin/Extremities: No rash, no edema Neurological Exam: alert and oriented to person, place. No aphasia, + moderate dysarthria (unchanged). Fund of knowledge is appropriate. Recent and remote memory impaired. 0/3 delayed recall. Attention and concentration are normal. Able to name objects and repeat phrases. Clock drawing 2/5 (wrote numbers counterclockwise) MMSE - Mini Mental State Exam 03/24/2017 03/19/2016 09/01/2015 11/30/2014  Orientation to time 0 1 0 1  Orientation to Place '4 4 2 3  '$ Registration '3 3 3 3  '$ Attention/ Calculation '5 5 5 4  '$ Recall 0 0 0 0  Language- name 2 objects '2 2 2 2  '$ Language- repeat '1 1 1 1  '$ Language- follow 3 step command '3 3 3 3  '$ Language- read & follow direction '1 1 1 1  '$ Write a sentence '1 1 1 1  '$ Copy  design '1 1 1 1  '$ Total score '21 22 19 20   '$ Cranial nerves: Pupils equal, round, reactive to light. Extraocular movements intact with no nystagmus. Visual fields full. Facial sensation intact. No facial asymmetry. Tongue, uvula, palate midline. Motor: Bulk and tone normal, muscle strength 5/5 throughout with no pronator drift. Sensation to light touch intact. No extinction to double simultaneous stimulation. Deep tendon reflexes 2+ throughout, toes downgoing. Finger to nose testing intact. Gait narrow-based and steady, able to tandem walk adequately. Romberg negative.  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 MMSE today is 21/30 (previously 22/30 in March 2017, 19/30 in September 2016, 20/30 in December 2015), indicating mild dementia. Clinically it appears he has had slight progression, mild to moderate in degree. Neurological exam non-focal except for moderate dysarthria. MRI brain unremarkable. Continue daily  Aricept '10mg'$ . We discussed the option of adding either Namenda or an SSRI for mood, his wife declines adding on another medication at this time. He does not drive. We again discussed the importance of physical and brain stimulation exercises, as well as control of vascular risk factors for brain health were also discussed. He will discuss neck issues with his PCP. He will follow-up in 1 year and knows to call our office for any changes.  Thank you for allowing me to participate in his care.  Please do not hesitate to call for any questions or concerns.  The duration of this appointment visit was 25 minutes of face-to-face time with the patient.  Greater than 50% of this time was spent in counseling, explanation of diagnosis, planning of further management, and coordination  of care.   Ellouise Newer, M.D.   CC: Dr. Delfina Redwood

## 2017-03-24 NOTE — Patient Instructions (Signed)
1. Continue Aricept '10mg'$  daily 2. Physical exercise and brain stimulation exercises are important for brain health 3. Follow-up in 1 year, call for any changes

## 2017-03-25 ENCOUNTER — Encounter (HOSPITAL_COMMUNITY): Payer: Self-pay

## 2017-03-25 ENCOUNTER — Observation Stay (HOSPITAL_COMMUNITY)
Admission: EM | Admit: 2017-03-25 | Discharge: 2017-03-28 | Disposition: A | Discharge status: Home / Self Care | Payer: Medicare Other | Attending: Internal Medicine | Admitting: Internal Medicine

## 2017-03-25 ENCOUNTER — Emergency Department (HOSPITAL_COMMUNITY): Payer: Medicare Other

## 2017-03-25 DIAGNOSIS — Z79899 Other long term (current) drug therapy: Secondary | ICD-10-CM | POA: Insufficient documentation

## 2017-03-25 DIAGNOSIS — Z923 Personal history of irradiation: Secondary | ICD-10-CM | POA: Diagnosis not present

## 2017-03-25 DIAGNOSIS — F03B18 Unspecified dementia, moderate, with other behavioral disturbance: Secondary | ICD-10-CM | POA: Diagnosis present

## 2017-03-25 DIAGNOSIS — I10 Essential (primary) hypertension: Secondary | ICD-10-CM | POA: Insufficient documentation

## 2017-03-25 DIAGNOSIS — J029 Acute pharyngitis, unspecified: Secondary | ICD-10-CM

## 2017-03-25 DIAGNOSIS — I6521 Occlusion and stenosis of right carotid artery: Secondary | ICD-10-CM | POA: Diagnosis not present

## 2017-03-25 DIAGNOSIS — I6523 Occlusion and stenosis of bilateral carotid arteries: Secondary | ICD-10-CM | POA: Diagnosis present

## 2017-03-25 DIAGNOSIS — C051 Malignant neoplasm of soft palate: Secondary | ICD-10-CM | POA: Diagnosis present

## 2017-03-25 DIAGNOSIS — Z7982 Long term (current) use of aspirin: Secondary | ICD-10-CM | POA: Diagnosis not present

## 2017-03-25 DIAGNOSIS — Z85819 Personal history of malignant neoplasm of unspecified site of lip, oral cavity, and pharynx: Secondary | ICD-10-CM | POA: Insufficient documentation

## 2017-03-25 DIAGNOSIS — J392 Other diseases of pharynx: Secondary | ICD-10-CM | POA: Insufficient documentation

## 2017-03-25 DIAGNOSIS — I252 Old myocardial infarction: Secondary | ICD-10-CM | POA: Diagnosis not present

## 2017-03-25 DIAGNOSIS — F0391 Unspecified dementia with behavioral disturbance: Secondary | ICD-10-CM | POA: Diagnosis present

## 2017-03-25 DIAGNOSIS — E785 Hyperlipidemia, unspecified: Secondary | ICD-10-CM | POA: Insufficient documentation

## 2017-03-25 DIAGNOSIS — F0390 Unspecified dementia without behavioral disturbance: Secondary | ICD-10-CM | POA: Diagnosis not present

## 2017-03-25 DIAGNOSIS — J020 Streptococcal pharyngitis: Secondary | ICD-10-CM | POA: Diagnosis not present

## 2017-03-25 DIAGNOSIS — J040 Acute laryngitis: Secondary | ICD-10-CM | POA: Diagnosis not present

## 2017-03-25 DIAGNOSIS — C770 Secondary and unspecified malignant neoplasm of lymph nodes of head, face and neck: Secondary | ICD-10-CM | POA: Insufficient documentation

## 2017-03-25 DIAGNOSIS — J449 Chronic obstructive pulmonary disease, unspecified: Secondary | ICD-10-CM | POA: Insufficient documentation

## 2017-03-25 DIAGNOSIS — C321 Malignant neoplasm of supraglottis: Principal | ICD-10-CM | POA: Insufficient documentation

## 2017-03-25 DIAGNOSIS — Z87891 Personal history of nicotine dependence: Secondary | ICD-10-CM | POA: Diagnosis not present

## 2017-03-25 DIAGNOSIS — R1310 Dysphagia, unspecified: Secondary | ICD-10-CM

## 2017-03-25 HISTORY — DX: Iron deficiency anemia, unspecified: D50.9

## 2017-03-25 HISTORY — DX: Acute myocardial infarction, unspecified: I21.9

## 2017-03-25 HISTORY — DX: Malignant neoplasm of unspecified part of unspecified bronchus or lung: C34.90

## 2017-03-25 HISTORY — DX: Occlusion and stenosis of right carotid artery: I65.21

## 2017-03-25 HISTORY — DX: Chronic obstructive pulmonary disease, unspecified: J44.9

## 2017-03-25 HISTORY — DX: Heart failure, unspecified: I50.9

## 2017-03-25 HISTORY — DX: Peripheral vascular disease, unspecified: I73.9

## 2017-03-25 HISTORY — DX: Malignant neoplasm of prostate: C61

## 2017-03-25 HISTORY — DX: Hypothyroidism, unspecified: E03.9

## 2017-03-25 HISTORY — DX: Essential (primary) hypertension: I10

## 2017-03-25 HISTORY — DX: Chronic kidney disease, unspecified: N18.9

## 2017-03-25 HISTORY — DX: Malignant neoplasm of soft palate: C05.1

## 2017-03-25 HISTORY — DX: Unspecified atrial fibrillation: I48.91

## 2017-03-25 HISTORY — DX: Gastro-esophageal reflux disease without esophagitis: K21.9

## 2017-03-25 LAB — COMPREHENSIVE METABOLIC PANEL
ALT: 11 U/L — AB (ref 17–63)
AST: 19 U/L (ref 15–41)
Albumin: 3.8 g/dL (ref 3.5–5.0)
Alkaline Phosphatase: 55 U/L (ref 38–126)
Anion gap: 10 (ref 5–15)
BUN: 12 mg/dL (ref 6–20)
CO2: 26 mmol/L (ref 22–32)
Calcium: 9.1 mg/dL (ref 8.9–10.3)
Chloride: 103 mmol/L (ref 101–111)
Creatinine, Ser: 0.91 mg/dL (ref 0.61–1.24)
GFR calc Af Amer: >60 mL/min (ref >60)
GFR calc non Af Amer: >60 mL/min (ref >60)
Glucose, Bld: 72 mg/dL (ref 65–99)
Potassium: 3.5 mmol/L (ref 3.5–5.1)
Sodium: 139 mmol/L (ref 135–145)
Total Bilirubin: 1 mg/dL (ref 0.3–1.2)
Total Protein: 6.7 g/dL (ref 6.5–8.1)

## 2017-03-25 LAB — CBC WITH DIFFERENTIAL/PLATELET
Basophils Absolute: 0 10*3/uL (ref 0.0–0.1)
Basophils Relative: 0 %
Eosinophils Absolute: 0 10*3/uL (ref 0.0–0.7)
Eosinophils Relative: 0 %
HCT: 40 % (ref 39.0–52.0)
Hemoglobin: 13.2 g/dL (ref 13.0–17.0)
LYMPHS ABS: 1.5 10*3/uL (ref 0.7–4.0)
LYMPHS PCT: 16 %
MCH: 30.7 pg (ref 26.0–34.0)
MCHC: 33 g/dL (ref 30.0–36.0)
MCV: 93 fL (ref 78.0–100.0)
MONO ABS: 0.5 10*3/uL (ref 0.1–1.0)
MONOS PCT: 5 %
Neutro Abs: 7.3 10*3/uL (ref 1.7–7.7)
Neutrophils Relative %: 79 %
Platelets: 238 10*3/uL (ref 150–400)
RBC: 4.3 MIL/uL (ref 4.22–5.81)
RDW: 13 % (ref 11.5–15.5)
WBC: 9.3 10*3/uL (ref 4.0–10.5)

## 2017-03-25 LAB — RAPID STREP SCREEN (MED CTR MEBANE ONLY): Streptococcus, Group A Screen (Direct): NEGATIVE

## 2017-03-25 MED ORDER — IOPAMIDOL (ISOVUE-300) INJECTION 61%
INTRAVENOUS | Status: AC
  Administered 2017-03-25: 75 mL
  Filled 2017-03-25: qty 75

## 2017-03-25 MED ORDER — DEXAMETHASONE SODIUM PHOSPHATE 10 MG/ML IJ SOLN
10.0000 mg | Freq: Once | INTRAMUSCULAR | Status: AC
  Administered 2017-03-25: 10 mg via INTRAVENOUS
  Filled 2017-03-25: qty 1

## 2017-03-25 NOTE — ED Provider Notes (Signed)
[facility] DEPT Provider Note   CSN: [id number] Arrival date & time: 03/25/17  1526   By signing my name below, I, Bryan Arellano, attest that this documentation has been prepared under the direction and in the presence of  Bryan Quill, NP. Electronically Signed: Delton Arellano, ED Scribe. 03/25/17. 5:43 PM.   History   Chief Complaint Chief Complaint  Patient presents with  . Sore Throat    HPI Comments:  Bryan Arellano is a 66 y.o. male, with a PMHx of cancer, who presents to the Emergency Department complaining of persistent, left sided sore throat x 2 days. His pain is worse with swallowing. Pt reports he is able to tolerate some liquids but has not been able to tolerate solid food. He also reports a voice change. No alleviating factors noted. Pt denies a cough, nausea, vomiting or any other associated symptoms. No other complaints noted.    The history is provided by the patient. No language interpreter was used.  Sore Throat  This is a new problem. The current episode started more than 2 days ago. The problem occurs constantly. The problem has not changed since onset.The symptoms are aggravated by swallowing. Nothing relieves the symptoms. He has tried nothing for the symptoms.    Past Medical History:  Diagnosis Date  . Cancer ([place]) 1999   head and neck  . Cancer ([place])   . Carotid artery occlusion   . History of radiation therapy 1999  . Pain around PEG tube site   . Radiation     Patient Active Problem List   Diagnosis Date Noted  . Carotid stenosis, right 01/02/2017  . Mild dementia 03/05/2015  . Cancer of soft palate ([place]) 03/05/2015  . Occlusion and stenosis of carotid artery without mention of cerebral infarction 02/25/2013  . 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 DYSPNEA 08/29/2008  . RADIATION THERAPY, HX OF 08/29/2008    Past Surgical History:  Procedure Laterality Date  . GASTROSTOMY TUBE PLACEMENT  2013   TUBE LATER REMOVED in 2013        Home Medications    Prior to Admission medications   Medication Sig Start Date End Date Taking? Authorizing Provider  aspirin 81 MG tablet Take 81 mg by mouth daily.    Historical Provider, MD  donepezil (ARICEPT) 10 MG tablet Take 1 tablet (10 mg total) by mouth daily. midday 03/24/17   Bryan Sprang, MD  simvastatin (ZOCOR) 10 MG tablet Take 1 tablet (10 mg total) by mouth every evening. 09/29/14   Bryan Earing, PA-C    Family History Family History  Problem Relation Age of Onset  . Heart disease Sister     before age 74  . Clotting disorder Sister   . Cancer Brother   . Heart disease Brother   . Heart attack Brother     Social History Social History  Substance Use Topics  . Smoking status: Former Smoker    Types: Cigarettes    Quit date: 04/18/1998  . Smokeless tobacco: Never Used  . Alcohol use No     Comment: pt's wife states that he quit drinking in 1997, pt use to drink about 80 beers per week and 900 shots of liquor per week     Allergies   Patient has no known allergies.   Review of Systems Review of Systems  Constitutional: Negative for fever.  HENT: Positive for sore throat.   Respiratory: Negative for cough.   Gastrointestinal: Negative  for nausea and vomiting.  All other systems reviewed and are negative.  Physical Exam Updated Vital Signs BP (!) 122/92   Pulse 86   Temp 97.5 F (36.4 C) (Oral)   Resp 18   Ht '5\' 5"'$  (1.651 m)   Wt 119 lb (54 kg)   SpO2 100%   BMI 19.80 kg/m   Physical Exam  Constitutional: He is oriented to person, place, and time. He appears well-developed and well-nourished.  HENT:  Head: Normocephalic and atraumatic.  Left side, posterior aspect of the oropharynx has discoloration and ulceration   Eyes: EOM are normal.  Neck: Normal range of motion.  Cardiovascular: Normal rate, regular rhythm, normal heart sounds and intact distal pulses.   Pulmonary/Chest: Effort normal and breath sounds normal. No  respiratory distress.  Abdominal: Soft. He exhibits no distension. There is no tenderness.  Musculoskeletal: Normal range of motion.  Neurological: He is alert and oriented to person, place, and time.  Skin: Skin is warm and dry.  Psychiatric: He has a normal mood and affect. Judgment normal.  Nursing note and vitals reviewed.    ED Treatments / Results  DIAGNOSTIC STUDIES:  Oxygen Saturation is 100% on RA, normal by my interpretation.    COORDINATION OF CARE:  5:39 PM Discussed treatment plan with pt at bedside and pt agreed to plan.  Labs (all labs ordered are listed, but only abnormal results are displayed) Labs Reviewed  COMPREHENSIVE METABOLIC PANEL - Abnormal; Notable for the following:       Result Value   ALT 11 (*)    All other components within normal limits  RAPID STREP SCREEN (NOT AT [facility])  CULTURE, GROUP A STREP ([place])  CBC WITH DIFFERENTIAL/PLATELET    EKG  EKG Interpretation None       Radiology Ct Soft Tissue Neck W Contrast  Result Date: 03/25/2017 CLINICAL DATA:  Dysphagia and voice changes. History of head and neck cancer, carotid artery occlusion. EXAM: CT NECK WITH CONTRAST TECHNIQUE: Multidetector CT imaging of the neck was performed using the standard protocol following the bolus administration of intravenous contrast. CONTRAST:  75 cc ISOVUE-300 IOPAMIDOL (ISOVUE-300) INJECTION 61% COMPARISON:  CT neck February 16, 2013 FINDINGS: PHARYNX AND LARYNX: Thickened, edematous epiglottis extending to the glossoepiglottic and glossopharyngeal folds. Thickened bilateral aryepiglottic folds, thickened edematous larynx. Minimal pooling secretions in the hypopharynx with debris. Effaced airway at the level of the larynx. SALIVARY GLANDS: Atrophic major salivary glands. THYROID: Normal. LYMPH NODES: No lymphadenopathy by CT size criteria. VASCULAR: Severe atherosclerosis. Chronically occluded LEFT external carotid artery. Severe stenosis RIGHT internal carotid  artery origin. Patent though narrowed bilateral internal jugular veins. LIMITED INTRACRANIAL: Normal. VISUALIZED ORBITS: Normal. MASTOIDS AND VISUALIZED PARANASAL SINUSES: Lobulated bilateral maxillary sinus probable polyps, versus mucosal retention cyst. SKELETON: Nonacute. No destructive bony lesions. Old mildly depressed bilateral nasal bone fractures. Patient is edentulous. UPPER CHEST: Lung apices are clear, centrilobular emphysema. Subcentimeter precarinal lymph node. OTHER: Postradiation changes of the anterior neck. IMPRESSION: Postradiation fibrosis and pharyngitis/laryngitis. Effaced airway airway at the level of the larynx may be transient. Suspected severe stenosis RIGHT internal carotid artery, similar to prior CT. Electronically Signed   By: Elon Alas M.D.   On: 03/25/2017 21:18    Procedures Procedures (including critical care time)  Medications Ordered in ED Medications  iopamidol (ISOVUE-300) 61 % injection (75 mLs  Contrast Given 03/25/17 2043)  dexamethasone (DECADRON) injection 10 mg (10 mg Intravenous Given 03/25/17 2202)     Initial Impression / Assessment  and Plan / ED Course  I have reviewed the triage vital signs and the nursing notes.  Pertinent labs & imaging results that were available during my care of the patient were reviewed by me and considered in my medical decision making (see chart for details).    Medical records reviewed.   Patient discussed with and seen by Dr. Sherry Ruffing. CT of neck with contrast ordered.  CT results reviewed and shared with patient.  Consult with ENT Janace Hoard).  Recommends admission with IVF and steroids. Will see the patient in the morning.    Final Clinical Impressions(s) / ED Diagnoses   Final diagnoses:  Pharyngitis, unspecified etiology  Laryngitis    New Prescriptions New Prescriptions   No medications on file    I personally performed the services described in this documentation, which was scribed in my  presence. The recorded information has been reviewed and is accurate.     Bryan Quill, NP 03/25/17 8177    Gwenyth Allegra Tegeler, MD 03/26/17 1100

## 2017-03-25 NOTE — ED Notes (Signed)
Patient transported to CT 

## 2017-03-25 NOTE — ED Triage Notes (Signed)
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 Pt reports knots on Lt side of neck

## 2017-03-25 NOTE — H&P (Signed)
History and Physical    Bryan Arellano KPT:[id number] DOB: 12-29-1951 DOA: 03/25/2017  Referring MD/NP/PA: [name], NP PCP: Kandice Hams, MD  Patient coming from: Home  Chief Complaint: Sore throat  HPI: Bryan Arellano is a 66 y.o. 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 Complains of left-sided throat pain and discomfort made worse with swallowing. Patient notes that symptoms have intermittently been occurring off and on over the last 2 months. Symptoms had not caused him to stop eating until now. He has had decreased oral intake over the last 2 days. Other associated symptoms include unknown amount of weight loss, change in voice, intermittent rhinorrhea, and subjective fevers. He has previously followed by Dr. Vicie Mutters of otolaryngology, but patient reports being released from his care 2-3 years ago. Currently, just followed by his primary care provider.   ED Course: On admission into the emergency department patient was seen to be afebrile with all vital signs relatively within normal limits. Lab work was unremarkable. Rapid strep was negative. 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 Dr. Janace Hoard of ENT was consulted and recommended IV steroids and monitoring overnight. Patient was given 10 mg of Decadron and TRH called to admit for observation.  Review of Systems: As per HPI otherwise 10 point review of systems negative.   Past Medical History:  Diagnosis Date  . Cancer ([place]) 1999   head and neck  . Cancer ([place])   . Carotid artery occlusion   . History of radiation therapy 1999  . Pain around PEG tube site   . Radiation     Past Surgical History:  Procedure Laterality Date  . GASTROSTOMY TUBE PLACEMENT  2013   TUBE LATER REMOVED in 2013     reports that he  quit smoking about 18 years ago. His smoking use included Cigarettes. He has never used smokeless tobacco. He reports that he does not drink alcohol or use drugs.  No Known Allergies  Family History  Problem Relation Age of Onset  . Heart disease Sister     before age 37  . Clotting disorder Sister   . Cancer Brother   . Heart disease Brother   . Heart attack Brother     Prior to Admission medications   Medication Sig Start Date End Date Taking? Authorizing Provider  aspirin 81 MG tablet Take 81 mg by mouth daily.    Historical Provider, MD  donepezil (ARICEPT) 10 MG tablet Take 1 tablet (10 mg total) by mouth daily. midday 03/24/17   Cameron Sprang, MD  simvastatin (ZOCOR) 10 MG tablet Take 1 tablet (10 mg total) by mouth every evening. 09/29/14   Gabriel Earing, PA-C    Physical Exam: Constitutional: Chronically ill-appearing cachectic male in NAD. Vitals:   03/25/17 1616 03/25/17 1617  BP: (!) 122/92   Pulse: 86   Resp: 18   Temp: 97.5 F (36.4 C)   TempSrc: Oral   SpO2: 100%   Weight:  54 kg (119 lb)  Height:  5' 5"  (1.651 m)   Eyes: PERRL, lids and conjunctivae normal ENMT: Mucous membranes are dry, posterior oropharynx irritation, with postnasal drainage appreciated. Neck: normal, supple, no masses, no thyromegaly Respiratory: clear to auscultation bilaterally, no wheezing, no crackles. Normal respiratory effort. No accessory muscle use.  Cardiovascular: Regular rate  and rhythm, no murmurs / rubs / gallops. No extremity edema. 2+ pedal pulses. No carotid bruits.  Abdomen: no tenderness, no masses palpated. No hepatosplenomegaly. Bowel sounds positive.  Musculoskeletal: no clubbing / cyanosis. No joint deformity upper and lower extremities. Good ROM, no contractures. Normal muscle tone.  Skin: Poor skin turgor. Neurologic: CN 2-12 grossly intact. Sensation intact, DTR normal. Strength 5/5 in all 4.  Psychiatric: Normal judgment and insight. Alert and oriented x 3.  Normal mood. Voice is soft and hoarse sounding.    Labs on Admission: I have personally reviewed following labs and imaging studies  CBC:  Recent Labs Lab 03/25/17 1858  WBC 9.3  NEUTROABS 7.3  HGB 13.2  HCT 40.0  MCV 93.0  PLT 496   Basic Metabolic Panel:  Recent Labs Lab 03/25/17 1858  NA 139  K 3.5  CL 103  CO2 26  GLUCOSE 72  BUN 12  CREATININE 0.91  CALCIUM 9.1   GFR: Estimated Creatinine Clearance: 60.2 mL/min (by C-G formula based on SCr of 0.91 mg/dL). Liver Function Tests:  Recent Labs Lab 03/25/17 1858  AST 19  ALT 11*  ALKPHOS 55  BILITOT 1.0  PROT 6.7  ALBUMIN 3.8   No results for input(s): LIPASE, AMYLASE in the last 168 hours. No results for input(s): AMMONIA in the last 168 hours. Coagulation Profile: No results for input(s): INR, PROTIME in the last 168 hours. Cardiac Enzymes: No results for input(s): CKTOTAL, CKMB, CKMBINDEX, TROPONINI in the last 168 hours. BNP (last 3 results) No results for input(s): PROBNP in the last 8760 hours. HbA1C: No results for input(s): HGBA1C in the last 72 hours. CBG: No results for input(s): GLUCAP in the last 168 hours. Lipid Profile: No results for input(s): CHOL, HDL, LDLCALC, TRIG, CHOLHDL, LDLDIRECT in the last 72 hours. Thyroid Function Tests: No results for input(s): TSH, T4TOTAL, FREET4, T3FREE, THYROIDAB in the last 72 hours. Anemia Panel: No results for input(s): VITAMINB12, FOLATE, FERRITIN, TIBC, IRON, RETICCTPCT in the last 72 hours. Urine analysis:    Component Value Date/Time   COLORURINE YELLOW 12/26/2012 1400   APPEARANCEUR CLEAR 12/26/2012 1400   LABSPEC 1.025 12/26/2012 1400   PHURINE 7.0 12/26/2012 1400   GLUCOSEU NEGATIVE 12/26/2012 1400   HGBUR TRACE (A) 12/26/2012 1400   BILIRUBINUR NEGATIVE 12/26/2012 1400   KETONESUR NEGATIVE 12/26/2012 1400   PROTEINUR NEGATIVE 12/26/2012 1400   UROBILINOGEN 1.0 12/26/2012 1400   NITRITE NEGATIVE 12/26/2012 1400   LEUKOCYTESUR  NEGATIVE 12/26/2012 1400   Sepsis Labs: Recent Results (from the past 240 hour(s))  Rapid strep screen     Status: None   Collection Time: 03/25/17  4:21 PM  Result Value Ref Range Status   Streptococcus, Group A Screen (Direct) NEGATIVE NEGATIVE Final    Comment: (NOTE) A Rapid Antigen test may result negative if the antigen level in the sample is below the detection level of this test. The FDA has not cleared this test as a stand-alone test therefore the rapid antigen negative result has reflexed to a Group A Strep culture.      Radiological Exams on Admission: Ct Soft Tissue Neck W Contrast  Result Date: 03/25/2017 CLINICAL DATA:  Dysphagia and voice changes. History of head and neck cancer, carotid artery occlusion. EXAM: CT NECK WITH CONTRAST TECHNIQUE: Multidetector CT imaging of the neck was performed using the standard protocol following the bolus administration of intravenous contrast. CONTRAST:  75 cc ISOVUE-300 IOPAMIDOL (ISOVUE-300) INJECTION 61% COMPARISON:  CT neck February 16, 2013 FINDINGS: PHARYNX AND LARYNX: Thickened, edematous epiglottis extending to the glossoepiglottic and glossopharyngeal folds. Thickened bilateral aryepiglottic folds, thickened edematous larynx. Minimal pooling secretions in the hypopharynx with debris. Effaced airway at the level of the larynx. SALIVARY GLANDS: Atrophic major salivary glands. THYROID: Normal. LYMPH NODES: No lymphadenopathy by CT size criteria. VASCULAR: Severe atherosclerosis. Chronically occluded LEFT external carotid artery. Severe stenosis RIGHT internal carotid artery origin. Patent though narrowed bilateral internal jugular veins. LIMITED INTRACRANIAL: Normal. VISUALIZED ORBITS: Normal. MASTOIDS AND VISUALIZED PARANASAL SINUSES: Lobulated bilateral maxillary sinus probable polyps, versus mucosal retention cyst. SKELETON: Nonacute. No destructive bony lesions. Old mildly depressed bilateral nasal bone fractures. Patient is edentulous.  UPPER CHEST: Lung apices are clear, centrilobular emphysema. Subcentimeter precarinal lymph node. OTHER: Postradiation changes of the anterior neck. IMPRESSION: Postradiation fibrosis and pharyngitis/laryngitis. Effaced airway airway at the level of the larynx may be transient. Suspected severe stenosis RIGHT internal carotid artery, similar to prior CT. Electronically Signed   By: Elon Alas M.D.   On: 03/25/2017 21:18     Assessment/Plan Odynophagia 2/2 Pharyngitis and laryngitis: Acute. As seen on CT scan of the neck. ENT was consulted and recommended patient be given 10 mg of Decadron IV. No clear source of infection noted at this time. - Admit to MedSurg bed - add on CRP and ESR if able - Chloraseptic Spray prn sore throat - Dr. Janace Hoard of ENT consulted, follow-up for further recommendations  H/O squamous cell carcinoma of the soft palate: S/p radiation  Right internal carotid artery stenosis: Patient has had stable high-grade stenosis of the right internal carotid for which he has been seen as an outpatient by Dr. Oneida Alar. - Continue outpatient follow-up with Dr. Oneida Alar   Mild Dementia  - Continue donepezil  Hyperlipidemia - Continue simvastatin    DVT prophylaxis: lovenox   Code Status: Full Family Communication: Discussed plan of care with the patient and family present at bedside Disposition Plan: Discharge home once medically stable Consults called: ENT  Admission status: Observation  Norval Morton MD [facility] Pager [phone]  If 7PM-7AM, please contact night-coverage [email or web address] Password [facility]  03/25/2017, 10:45 PM

## 2017-03-25 NOTE — ED Triage Notes (Signed)
Pt presents to the er with complaints of pain in his throat that feels like knots that is worse with swallowing, pt denies any other symptoms or worse pain with movement.  Per family he has not been able to eat or drink over the past 48 hours due to the pain

## 2017-03-26 ENCOUNTER — Encounter (HOSPITAL_COMMUNITY): Payer: Self-pay | Admitting: General Practice

## 2017-03-26 DIAGNOSIS — J387 Other diseases of larynx: Secondary | ICD-10-CM | POA: Diagnosis not present

## 2017-03-26 DIAGNOSIS — E785 Hyperlipidemia, unspecified: Secondary | ICD-10-CM | POA: Diagnosis not present

## 2017-03-26 DIAGNOSIS — R1310 Dysphagia, unspecified: Secondary | ICD-10-CM | POA: Diagnosis not present

## 2017-03-26 DIAGNOSIS — J040 Acute laryngitis: Secondary | ICD-10-CM | POA: Diagnosis present

## 2017-03-26 DIAGNOSIS — F0390 Unspecified dementia without behavioral disturbance: Secondary | ICD-10-CM | POA: Diagnosis not present

## 2017-03-26 DIAGNOSIS — J020 Streptococcal pharyngitis: Secondary | ICD-10-CM

## 2017-03-26 LAB — BASIC METABOLIC PANEL
Anion gap: 14 (ref 5–15)
BUN: 15 mg/dL (ref 6–20)
CHLORIDE: 102 mmol/L (ref 101–111)
CO2: 21 mmol/L — AB (ref 22–32)
CREATININE: 1.04 mg/dL (ref 0.61–1.24)
Calcium: 9.2 mg/dL (ref 8.9–10.3)
GFR calc non Af Amer: >60 mL/min (ref >60)
Glucose, Bld: 96 mg/dL (ref 65–99)
Potassium: 3.7 mmol/L (ref 3.5–5.1)
Sodium: 137 mmol/L (ref 135–145)

## 2017-03-26 LAB — CBC
HEMATOCRIT: 43 % (ref 39.0–52.0)
HEMOGLOBIN: 14.3 g/dL (ref 13.0–17.0)
MCH: 30.9 pg (ref 26.0–34.0)
MCHC: 33.3 g/dL (ref 30.0–36.0)
MCV: 92.9 fL (ref 78.0–100.0)
Platelets: 228 10*3/uL (ref 150–400)
RBC: 4.63 MIL/uL (ref 4.22–5.81)
RDW: 13 % (ref 11.5–15.5)
WBC: 6.2 10*3/uL (ref 4.0–10.5)

## 2017-03-26 LAB — C-REACTIVE PROTEIN: CRP: 0.9 mg/dL (ref <1.0)

## 2017-03-26 LAB — CULTURE, GROUP A STREP (THRC)

## 2017-03-26 LAB — SEDIMENTATION RATE: Sed Rate: 15 mm/hr (ref 0–16)

## 2017-03-26 MED ORDER — LIDOCAINE HCL 4 % EX SOLN
0.0000 mL | Freq: Once | CUTANEOUS | Status: DC | PRN
  Filled 2017-03-26: qty 50

## 2017-03-26 MED ORDER — ACETAMINOPHEN 325 MG PO TABS
650.0000 mg | ORAL_TABLET | Freq: Four times a day (QID) | ORAL | Status: DC | PRN
  Administered 2017-03-27: 650 mg via ORAL
  Filled 2017-03-26: qty 2

## 2017-03-26 MED ORDER — SIMVASTATIN 10 MG PO TABS
10.0000 mg | ORAL_TABLET | Freq: Every evening | ORAL | Status: DC
  Administered 2017-03-26 – 2017-03-27 (×3): 10 mg via ORAL
  Filled 2017-03-26 (×3): qty 1

## 2017-03-26 MED ORDER — AMOXICILLIN 250 MG/5ML PO SUSR
250.0000 mg | Freq: Three times a day (TID) | ORAL | Status: DC
  Administered 2017-03-26 – 2017-03-28 (×4): 250 mg via ORAL
  Filled 2017-03-26 (×6): qty 5

## 2017-03-26 MED ORDER — SILVER NITRATE-POT NITRATE 75-25 % EX MISC
1.0000 | Freq: Once | CUTANEOUS | Status: DC | PRN
  Filled 2017-03-26: qty 1

## 2017-03-26 MED ORDER — DONEPEZIL HCL 10 MG PO TABS
10.0000 mg | ORAL_TABLET | Freq: Every day | ORAL | Status: DC
  Administered 2017-03-26 (×2): 10 mg via ORAL
  Filled 2017-03-26 (×4): qty 1

## 2017-03-26 MED ORDER — SODIUM CHLORIDE 0.9 % IV SOLN
3.0000 g | Freq: Three times a day (TID) | INTRAVENOUS | Status: DC
  Filled 2017-03-26 (×2): qty 3

## 2017-03-26 MED ORDER — SODIUM CHLORIDE 0.9 % IV SOLN
INTRAVENOUS | Status: DC
  Administered 2017-03-26: 03:00:00 via INTRAVENOUS

## 2017-03-26 MED ORDER — LIDOCAINE HCL 2 % EX GEL
1.0000 "application " | Freq: Once | CUTANEOUS | Status: DC | PRN
  Filled 2017-03-26: qty 5

## 2017-03-26 MED ORDER — ASPIRIN 81 MG PO CHEW
81.0000 mg | CHEWABLE_TABLET | Freq: Every day | ORAL | Status: DC
  Administered 2017-03-26 – 2017-03-27 (×3): 81 mg via ORAL
  Filled 2017-03-26 (×4): qty 1

## 2017-03-26 MED ORDER — ENOXAPARIN SODIUM 40 MG/0.4ML ~~LOC~~ SOLN
40.0000 mg | SUBCUTANEOUS | Status: DC
  Administered 2017-03-26: 40 mg via SUBCUTANEOUS
  Filled 2017-03-26: qty 0.4

## 2017-03-26 MED ORDER — ALBUTEROL SULFATE (2.5 MG/3ML) 0.083% IN NEBU: 2.5000 mg | INHALATION_SOLUTION | RESPIRATORY_TRACT | Status: DC | PRN

## 2017-03-26 MED ORDER — AMOXICILLIN 250 MG/5ML PO SUSR
250.0000 mg | Freq: Three times a day (TID) | ORAL | Status: DC
  Administered 2017-03-26: 250 mg via ORAL
  Filled 2017-03-26: qty 5

## 2017-03-26 MED ORDER — ONDANSETRON HCL 4 MG PO TABS: 4.0000 mg | ORAL_TABLET | Freq: Four times a day (QID) | ORAL | Status: DC | PRN

## 2017-03-26 MED ORDER — OXYMETAZOLINE HCL 0.05 % NA SOLN
1.0000 | Freq: Once | NASAL | Status: DC | PRN
  Filled 2017-03-26: qty 15

## 2017-03-26 MED ORDER — TRIPLE ANTIBIOTIC 3.5-400-5000 EX OINT
1.0000 "application " | TOPICAL_OINTMENT | Freq: Once | CUTANEOUS | Status: DC | PRN
  Filled 2017-03-26: qty 1

## 2017-03-26 MED ORDER — LORAZEPAM 0.5 MG PO TABS
0.5000 mg | ORAL_TABLET | Freq: Four times a day (QID) | ORAL | Status: DC | PRN
  Administered 2017-03-26: 0.5 mg via ORAL
  Filled 2017-03-26 (×2): qty 1

## 2017-03-26 MED ORDER — LIDOCAINE-EPINEPHRINE (PF) 1 %-1:200000 IJ SOLN
0.0000 mL | Freq: Once | INTRAMUSCULAR | Status: DC | PRN
  Filled 2017-03-26: qty 30

## 2017-03-26 MED ORDER — ACETAMINOPHEN 650 MG RE SUPP: 650.0000 mg | Freq: Four times a day (QID) | RECTAL | Status: DC | PRN

## 2017-03-26 MED ORDER — ONDANSETRON HCL 4 MG/2ML IJ SOLN: 4.0000 mg | Freq: Four times a day (QID) | INTRAMUSCULAR | Status: DC | PRN

## 2017-03-26 NOTE — ED Notes (Signed)
Patient received nighttime medications from caregiver, NP and Inpatient MD aware. Donepezil (Aricept) 10 mg tablet Simvastatin (Zocor) 10 mg tablet Aspirin 81 mg tablet

## 2017-03-26 NOTE — Consult Note (Signed)
  Patient is to have a fiberoptic exam which was performed after discussing risks, benefits, and options. All his questions are answered and consent was obtained. The scope was passed to the left nares and into the nasopharynx which looked clear. The base of tongue and posterior aspects of the palate looks clear. The piriforms both are without any evidence of mass or lesion. The epiglottis is irregularly shaped with a exophytic ulcerative mass that is on the laryngeal surface of the epiglottis extending down toward the left side with exophytic tissue. The inferior laryngeal surface area looks clear and the vocal cords both move normally and no evidence of any lesions. The airway is open but the epiglottis does tilt posteriorly obscuring the view initially. He tolerated the procedure well as the scope was removed.  His sister was present and we discussed the options at this point. He needs to have a direct laryngoscopy with biopsy to see if this indeed is a carcinoma which is certainly looks like. He's had radiation treatments previously and his fields may be inclusive of the larynx therefore not able to get any further treatment. Total laryngectomy may be his only option. We discussed a direct laryngoscopy. We discussed risks, benefits, and options. All his questions are answered and consent was obtained.

## 2017-03-26 NOTE — ED Notes (Signed)
Report called to [facility] on 5W requested we wait 10 mins so they can get a bed in the room

## 2017-03-26 NOTE — Care Management Obs Status (Signed)
[facility] NOTIFICATION   Patient Details  Name: Boniface Goffe MRN: [id number] Date of Birth: [birth year]   Medicare Observation Status Notification Given:  Yes    Sharin Mons, RN 03/26/2017, 4:00 PM

## 2017-03-26 NOTE — ED Notes (Signed)
Patient states he is feeling much better. Denies pain with swallowing at present.

## 2017-03-26 NOTE — Consult Note (Signed)
Reason for Consult:hoarseness and airway swelling Referring Physician: hospitalist  Bryan Arellano is an 66 y.o. male.  HPI: hx of SCCA of the palate and had XRT. The family says it was 19 years ago. He has episodes of pain and swelling through the years but this episode is lasting longer. He is having sluight breathing problem. He is not swallowing well. He has been having issues for about 3-4 days.   Past Medical History:  Diagnosis Date  . Cancer ([place]) 1999   head and neck  . Cancer ([place])   . Carotid artery occlusion   . History of radiation therapy 1999  . Pain around PEG tube site   . Radiation     Past Surgical History:  Procedure Laterality Date  . GASTROSTOMY TUBE PLACEMENT  2013   TUBE LATER REMOVED in 2013    Family History  Problem Relation Age of Onset  . Heart disease Sister     before age 48  . Clotting disorder Sister   . Cancer Brother   . Heart disease Brother   . Heart attack Brother     Social History:  reports that he quit smoking about 18 years ago. His smoking use included Cigarettes. He has never used smokeless tobacco. He reports that he does not drink alcohol or use drugs.  Allergies: No Known Allergies  Medications: I have reviewed the patient's current medications.  Results for orders placed or performed during the hospital encounter of 03/25/17 (from the past 48 hour(s))  Rapid strep screen     Status: None   Collection Time: 03/25/17  4:21 PM  Result Value Ref Range   Streptococcus, Group A Screen (Direct) NEGATIVE NEGATIVE    Comment: (NOTE) A Rapid Antigen test may result negative if the antigen level in the sample is below the detection level of this test. The FDA has not cleared this test as a stand-alone test therefore the rapid antigen negative result has reflexed to a Group A Strep culture.   Culture, group A strep     Status: None   Collection Time: 03/25/17  4:21 PM  Result Value Ref Range   Specimen Description THROAT     Special Requests NONE Reflexed from [id number]    Culture MODERATE STREPTOCOCCUS,BETA HEMOLYTIC NOT GROUP A    Report Status 03/26/2017 FINAL   CBC with Differential/Platelet     Status: None   Collection Time: 03/25/17  6:58 PM  Result Value Ref Range   WBC 9.3 4.0 - 10.5 K/uL   RBC 4.30 4.22 - 5.81 MIL/uL   Hemoglobin 13.2 13.0 - 17.0 g/dL   HCT 40.0 39.0 - 52.0 %   MCV 93.0 78.0 - 100.0 fL   MCH 30.7 26.0 - 34.0 pg   MCHC 33.0 30.0 - 36.0 g/dL   RDW 13.0 11.5 - 15.5 %   Platelets 238 150 - 400 K/uL   Neutrophils Relative % 79 %   Neutro Abs 7.3 1.7 - 7.7 K/uL   Lymphocytes Relative 16 %   Lymphs Abs 1.5 0.7 - 4.0 K/uL   Monocytes Relative 5 %   Monocytes Absolute 0.5 0.1 - 1.0 K/uL   Eosinophils Relative 0 %   Eosinophils Absolute 0.0 0.0 - 0.7 K/uL   Basophils Relative 0 %   Basophils Absolute 0.0 0.0 - 0.1 K/uL  Comprehensive metabolic panel     Status: Abnormal   Collection Time: 03/25/17  6:58 PM  Result Value Ref Range   Sodium 139 135 -  145 mmol/L   Potassium 3.5 3.5 - 5.1 mmol/L   Chloride 103 101 - 111 mmol/L   CO2 26 22 - 32 mmol/L   Glucose, Bld 72 65 - 99 mg/dL   BUN 12 6 - 20 mg/dL   Creatinine, Ser 0.91 0.61 - 1.24 mg/dL   Calcium 9.1 8.9 - 10.3 mg/dL   Total Protein 6.7 6.5 - 8.1 g/dL   Albumin 3.8 3.5 - 5.0 g/dL   AST 19 15 - 41 U/L   ALT 11 (L) 17 - 63 U/L   Alkaline Phosphatase 55 38 - 126 U/L   Total Bilirubin 1.0 0.3 - 1.2 mg/dL   GFR calc non Af Amer >60 >60 mL/min   GFR calc Af Amer >60 >60 mL/min    Comment: (NOTE) The eGFR has been calculated using the CKD EPI equation. This calculation has not been validated in all clinical situations. eGFR's persistently <60 mL/min signify possible Chronic Kidney Disease.    Anion gap 10 5 - 15  CBC     Status: None   Collection Time: 03/26/17  5:05 AM  Result Value Ref Range   WBC 6.2 4.0 - 10.5 K/uL   RBC 4.63 4.22 - 5.81 MIL/uL   Hemoglobin 14.3 13.0 - 17.0 g/dL   HCT 43.0 39.0 - 52.0 %   MCV 92.9  78.0 - 100.0 fL   MCH 30.9 26.0 - 34.0 pg   MCHC 33.3 30.0 - 36.0 g/dL   RDW 13.0 11.5 - 15.5 %   Platelets 228 150 - 400 K/uL  Basic metabolic panel     Status: Abnormal   Collection Time: 03/26/17  5:05 AM  Result Value Ref Range   Sodium 137 135 - 145 mmol/L   Potassium 3.7 3.5 - 5.1 mmol/L   Chloride 102 101 - 111 mmol/L   CO2 21 (L) 22 - 32 mmol/L   Glucose, Bld 96 65 - 99 mg/dL   BUN 15 6 - 20 mg/dL   Creatinine, Ser 1.04 0.61 - 1.24 mg/dL   Calcium 9.2 8.9 - 10.3 mg/dL   GFR calc non Af Amer >60 >60 mL/min   GFR calc Af Amer >60 >60 mL/min    Comment: (NOTE) The eGFR has been calculated using the CKD EPI equation. This calculation has not been validated in all clinical situations. eGFR's persistently <60 mL/min signify possible Chronic Kidney Disease.    Anion gap 14 5 - 15  C-reactive protein     Status: None   Collection Time: 03/26/17  5:05 AM  Result Value Ref Range   CRP 0.9 <1.0 mg/dL  Sedimentation rate     Status: None   Collection Time: 03/26/17  5:05 AM  Result Value Ref Range   Sed Rate 15 0 - 16 mm/hr    Ct Soft Tissue Neck W Contrast  Result Date: 03/25/2017 CLINICAL DATA:  Dysphagia and voice changes. History of head and neck cancer, carotid artery occlusion. EXAM: CT NECK WITH CONTRAST TECHNIQUE: Multidetector CT imaging of the neck was performed using the standard protocol following the bolus administration of intravenous contrast. CONTRAST:  75 cc ISOVUE-300 IOPAMIDOL (ISOVUE-300) INJECTION 61% COMPARISON:  CT neck February 16, 2013 FINDINGS: PHARYNX AND LARYNX: Thickened, edematous epiglottis extending to the glossoepiglottic and glossopharyngeal folds. Thickened bilateral aryepiglottic folds, thickened edematous larynx. Minimal pooling secretions in the hypopharynx with debris. Effaced airway at the level of the larynx. SALIVARY GLANDS: Atrophic major salivary glands. THYROID: Normal. LYMPH NODES: No lymphadenopathy by CT  size criteria. VASCULAR: Severe  atherosclerosis. Chronically occluded LEFT external carotid artery. Severe stenosis RIGHT internal carotid artery origin. Patent though narrowed bilateral internal jugular veins. LIMITED INTRACRANIAL: Normal. VISUALIZED ORBITS: Normal. MASTOIDS AND VISUALIZED PARANASAL SINUSES: Lobulated bilateral maxillary sinus probable polyps, versus mucosal retention cyst. SKELETON: Nonacute. No destructive bony lesions. Old mildly depressed bilateral nasal bone fractures. Patient is edentulous. UPPER CHEST: Lung apices are clear, centrilobular emphysema. Subcentimeter precarinal lymph node. OTHER: Postradiation changes of the anterior neck. IMPRESSION: Postradiation fibrosis and pharyngitis/laryngitis. Effaced airway airway at the level of the larynx may be transient. Suspected severe stenosis RIGHT internal carotid artery, similar to prior CT. Electronically Signed   By: Elon Alas M.D.   On: 03/25/2017 21:18    ROS Blood pressure (!) 159/75, pulse (!) 108, temperature 97.9 F (36.6 C), resp. rate 18, height 5' 5"  (1.651 m), weight 54 kg (119 lb), SpO2 100 %. Physical Exam  Constitutional: He appears well-developed.  HENT:  Head: Normocephalic.  He is awake but not making complete sense. He has no swelling of the face. OC/OP- there is no swelling and the tissue are normal. There is a brownish yellow material on the posterior wall like PND. Neck has no mass or swelling. His voice sounds slightly muffled. No stridor.     Assessment/Plan: Hoarseness/dysphagia- he likley has an infection and needs IV antibiotics and steroids. He will get fibroptic scope today to assess the airway and tissue.   Melissa Montane 03/26/2017, 7:59 AM

## 2017-03-26 NOTE — Progress Notes (Signed)
Pharmacy Antibiotic Note  Bryan Arellano is a 66 y.o. male admitted on 03/25/2017 with sore throat.  Pt has a hx of SCC of the palate s/p radiation in 1999.  He reports throat swelling and dysphagia as well.  ENT has seen and recommends biopsy to rule out carcinoma.  Of note, patient also strep positive.  Pharmacy has been consulted for IV antibiotic recommendations for strep given difficulty with swallowing.    Plan: Unasyn 3gm IV q8h. Pharmacy will sign off.  Height: '5\' 5"'$  (165.1 cm) Weight: 119 lb (54 kg) IBW/kg (Calculated) : 61.5  Temp (24hrs), Avg:97.7 F (36.5 C), Min:97.5 F (36.4 C), Max:97.9 F (36.6 C)   Recent Labs Lab 03/25/17 1858 03/26/17 0505  WBC 9.3 6.2  CREATININE 0.91 1.04    Estimated Creatinine Clearance: 52.6 mL/min (by C-G formula based on SCr of 1.04 mg/dL).    No Known Allergies   Thank you for allowing pharmacy to be a part of this patient's care.  [employer], Pharm.D., BCPS Clinical Pharmacist Pager [phone] 03/26/2017 2:29 PM

## 2017-03-26 NOTE — ED Notes (Signed)
Patient is oriented to place , states he is able to swallow however it hurts. Wife was also instructed not to give patient medication , ED staff would need to administer all medication.

## 2017-03-26 NOTE — Progress Notes (Signed)
Patient ID: Bryan Arellano, male   DOB: Dec 01, 1951, 66 y.o.   MRN: [id number]  PROGRESS NOTE    Bryan Arellano  ONG:[id number] DOB: [birth year] DOA: 03/25/2017  PCP: Kandice Hams, MD   Brief Narrative:  66 y.o. 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 Patient notes that symptoms have intermittently been occurring off and on over the past 2 months. Symptoms had not caused him to stop eating until a day prior to the admission. He is found to have strep throat. He is seen by ENT in consultation.     Assessment & Plan:   Principal Problem:   Odynophagia and dysphagia in the setting of strep throat  - Plan for fibroptic scope today - Will start amoxicillin, pt pulled IV line  - Had 1 dose decadron - CT of soft tissue indicated that his swallowing difficulty may be from radiation changes but he has had RT back in 1999  Active Problems:   Mild dementia without behavioral disturbance  - Stable  - Continue Aricept     Dyslipidemia - Continue zocor    DVT prophylaxis: Lovenox subQ Code Status: full code  Family Communication: wife at the bedside this am Disposition Plan: plan for fibroptic scope today   Consultants:   ENT, Dr. Janace Hoard   Procedures:   None   Antimicrobials:   Amoxil 03/26/2017 -->  Subjective: No overnight events.  Objective: Vitals:   03/26/17 0400 03/26/17 0500 03/26/17 0600 03/26/17 0644  BP: 107/75 111/81 107/71 (!) 159/75  Pulse: 92 85 (!) 101 (!) 108  Resp:    18  Temp:    97.9 F (36.6 C)  TempSrc:      SpO2: 97% 99% 99% 100%  Weight:      Height:       No intake or output data in the 24 hours ending 03/26/17 1348 Filed Weights   03/25/17 1617  Weight: 54 kg (119 lb)    Examination:  General exam: Appears calm and comfortable    Respiratory system: Clear to auscultation. Respiratory effort normal. Cardiovascular system: S1 & S2 heard, Rate controlled  Gastrointestinal system: Abdomen is nondistended, soft and nontender. No organomegaly or masses felt. Normal bowel sounds heard. Central nervous system: Alert and oriented. No focal neurological deficits. Extremities: Symmetric 5 x 5 power. Skin: No rashes, lesions or ulcers Psychiatry: Judgement and insight appear normal. Mood & affect appropriate.   Data Reviewed: I have personally reviewed following labs and imaging studies  CBC:  Recent Labs Lab 03/25/17 1858 03/26/17 0505  WBC 9.3 6.2  NEUTROABS 7.3  --   HGB 13.2 14.3  HCT 40.0 43.0  MCV 93.0 92.9  PLT 238 440   Basic Metabolic Panel:  Recent Labs Lab 03/25/17 1858 03/26/17 0505  NA 139 137  K 3.5 3.7  CL 103 102  CO2 26 21*  GLUCOSE 72 96  BUN 12 15  CREATININE 0.91 1.04  CALCIUM 9.1 9.2   GFR: Estimated Creatinine Clearance: 52.6 mL/min (by C-G formula based on SCr of 1.04 mg/dL). Liver Function Tests:  Recent Labs Lab 03/25/17 1858  AST 19  ALT 11*  ALKPHOS 55  BILITOT 1.0  PROT 6.7  ALBUMIN 3.8   No results for input(s): LIPASE, AMYLASE in the last 168 hours. No results for input(s): AMMONIA  in the last 168 hours. Coagulation Profile: No results for input(s): INR, PROTIME in the last 168 hours. Cardiac Enzymes: No results for input(s): CKTOTAL, CKMB, CKMBINDEX, TROPONINI in the last 168 hours. BNP (last 3 results) No results for input(s): PROBNP in the last 8760 hours. HbA1C: No results for input(s): HGBA1C in the last 72 hours. CBG: No results for input(s): GLUCAP in the last 168 hours. Lipid Profile: No results for input(s): CHOL, HDL, LDLCALC, TRIG, CHOLHDL, LDLDIRECT in the last 72 hours. Thyroid Function Tests: No results for input(s): TSH, T4TOTAL, FREET4, T3FREE, THYROIDAB in the last 72 hours. Anemia Panel: No results for input(s): VITAMINB12, FOLATE,  FERRITIN, TIBC, IRON, RETICCTPCT in the last 72 hours. Urine analysis:    Component Value Date/Time   COLORURINE YELLOW 12/26/2012 1400   APPEARANCEUR CLEAR 12/26/2012 1400   LABSPEC 1.025 12/26/2012 1400   PHURINE 7.0 12/26/2012 1400   GLUCOSEU NEGATIVE 12/26/2012 1400   HGBUR TRACE (A) 12/26/2012 1400   BILIRUBINUR NEGATIVE 12/26/2012 1400   KETONESUR NEGATIVE 12/26/2012 1400   PROTEINUR NEGATIVE 12/26/2012 1400   UROBILINOGEN 1.0 12/26/2012 1400   NITRITE NEGATIVE 12/26/2012 1400   LEUKOCYTESUR NEGATIVE 12/26/2012 1400   Sepsis Labs: '@LABRCNTIP'$ (procalcitonin:4,lacticidven:4)   Rapid strep screen     Status: None   Collection Time: 03/25/17  4:21 PM  Result Value Ref Range Status   Streptococcus, Group A Screen (Direct) NEGATIVE NEGATIVE Final    Comment: (NOTE) A Rapid Antigen test may result negative if the antigen level in the sample is below the detection level of this test. The FDA has not cleared this test as a stand-alone test therefore the rapid antigen negative result has reflexed to a Group A Strep culture.   Culture, group A strep     Status: None   Collection Time: 03/25/17  4:21 PM  Result Value Ref Range Status   Specimen Description THROAT  Final   Special Requests NONE Reflexed from [id number]  Final   Culture MODERATE STREPTOCOCCUS,BETA HEMOLYTIC NOT GROUP A  Final   Report Status 03/26/2017 FINAL  Final      Radiology Studies: Ct Soft Tissue Neck W Contrast Result Date: 03/25/2017 Postradiation fibrosis and pharyngitis/laryngitis. Effaced airway airway at the level of the larynx may be transient. Suspected severe stenosis RIGHT internal carotid artery, similar to prior CT. Electronically Signed   By: Elon Alas M.D.   On: 03/25/2017 21:18     Scheduled Meds: . aspirin  81 mg Oral Daily  . donepezil  10 mg Oral Daily  . enoxaparin (LOVENOX) injection  40 mg Subcutaneous Q24H  . simvastatin  10 mg Oral QPM   Continuous Infusions: . sodium  chloride 75 mL/hr at 03/26/17 1300     LOS: 0 days    Time spent: 25 minutes  Greater than 50% of the time spent on counseling and coordinating the care.   Leisa Lenz, MD [facility] Pager [phone]  If 7PM-7AM, please contact night-coverage [email or web address] Password [email or web address] 03/26/2017, 1:48 PM

## 2017-03-27 ENCOUNTER — Encounter (HOSPITAL_COMMUNITY)
Admission: EM | Disposition: A | Discharge status: Home / Self Care | Payer: Self-pay | Source: Home / Self Care | Attending: Emergency Medicine

## 2017-03-27 ENCOUNTER — Observation Stay (HOSPITAL_COMMUNITY): Payer: Medicare Other | Admitting: Certified Registered Nurse Anesthetist

## 2017-03-27 ENCOUNTER — Encounter (HOSPITAL_COMMUNITY): Payer: Self-pay | Admitting: Certified Registered Nurse Anesthetist

## 2017-03-27 DIAGNOSIS — F0390 Unspecified dementia without behavioral disturbance: Secondary | ICD-10-CM | POA: Diagnosis not present

## 2017-03-27 DIAGNOSIS — J387 Other diseases of larynx: Secondary | ICD-10-CM

## 2017-03-27 DIAGNOSIS — J020 Streptococcal pharyngitis: Secondary | ICD-10-CM | POA: Diagnosis not present

## 2017-03-27 DIAGNOSIS — J392 Other diseases of pharynx: Secondary | ICD-10-CM | POA: Diagnosis not present

## 2017-03-27 DIAGNOSIS — R1310 Dysphagia, unspecified: Secondary | ICD-10-CM | POA: Diagnosis not present

## 2017-03-27 DIAGNOSIS — J040 Acute laryngitis: Secondary | ICD-10-CM | POA: Diagnosis not present

## 2017-03-27 HISTORY — PX: DIRECT LARYNGOSCOPY: SHX5326

## 2017-03-27 HISTORY — PX: ESOPHAGOSCOPY: SHX5534

## 2017-03-27 LAB — BASIC METABOLIC PANEL
Anion gap: 9 (ref 5–15)
BUN: 16 mg/dL (ref 6–20)
CALCIUM: 8.9 mg/dL (ref 8.9–10.3)
CHLORIDE: 104 mmol/L (ref 101–111)
CO2: 24 mmol/L (ref 22–32)
CREATININE: 0.9 mg/dL (ref 0.61–1.24)
GFR calc Af Amer: >60 mL/min (ref >60)
GFR calc non Af Amer: >60 mL/min (ref >60)
GLUCOSE: 93 mg/dL (ref 65–99)
Potassium: 4.3 mmol/L (ref 3.5–5.1)
Sodium: 137 mmol/L (ref 135–145)

## 2017-03-27 LAB — CBC
HEMATOCRIT: 39.1 % (ref 39.0–52.0)
HEMOGLOBIN: 13.3 g/dL (ref 13.0–17.0)
MCH: 31.1 pg (ref 26.0–34.0)
MCHC: 34 g/dL (ref 30.0–36.0)
MCV: 91.4 fL (ref 78.0–100.0)
Platelets: 210 10*3/uL (ref 150–400)
RBC: 4.28 MIL/uL (ref 4.22–5.81)
RDW: 12.8 % (ref 11.5–15.5)
WBC: 9.1 10*3/uL (ref 4.0–10.5)

## 2017-03-27 SURGERY — LARYNGOSCOPY, DIRECT
Anesthesia: General | Site: Throat

## 2017-03-27 MED ORDER — ONDANSETRON HCL 4 MG/2ML IJ SOLN
INTRAMUSCULAR | Status: DC | PRN
  Administered 2017-03-27: 4 mg via INTRAVENOUS

## 2017-03-27 MED ORDER — FENTANYL CITRATE (PF) 250 MCG/5ML IJ SOLN
INTRAMUSCULAR | Status: AC
  Filled 2017-03-27: qty 5

## 2017-03-27 MED ORDER — PROPOFOL 10 MG/ML IV BOLUS
INTRAVENOUS | Status: AC
  Filled 2017-03-27: qty 20

## 2017-03-27 MED ORDER — FENTANYL CITRATE (PF) 100 MCG/2ML IJ SOLN: 25.0000 ug | INTRAMUSCULAR | Status: DC | PRN

## 2017-03-27 MED ORDER — EPINEPHRINE HCL (NASAL) 0.1 % NA SOLN
NASAL | Status: AC
  Filled 2017-03-27: qty 30

## 2017-03-27 MED ORDER — EPINEPHRINE HCL (NASAL) 0.1 % NA SOLN
NASAL | Status: DC | PRN
  Administered 2017-03-27: 30 mL via NASAL

## 2017-03-27 MED ORDER — MIDAZOLAM HCL 2 MG/2ML IJ SOLN
INTRAMUSCULAR | Status: AC
  Filled 2017-03-27: qty 2

## 2017-03-27 MED ORDER — OXYCODONE HCL 5 MG PO TABS: 5.0000 mg | ORAL_TABLET | Freq: Once | ORAL | Status: DC | PRN

## 2017-03-27 MED ORDER — OXYCODONE HCL 5 MG/5ML PO SOLN: 5.0000 mg | Freq: Once | ORAL | Status: DC | PRN

## 2017-03-27 MED ORDER — PHENYLEPHRINE HCL 10 MG/ML IJ SOLN: INTRAMUSCULAR | Status: DC | PRN

## 2017-03-27 MED ORDER — LACTATED RINGERS IV SOLN
INTRAVENOUS | Status: DC | PRN
  Administered 2017-03-27: 11:00:00 via INTRAVENOUS

## 2017-03-27 MED ORDER — FENTANYL CITRATE (PF) 100 MCG/2ML IJ SOLN
INTRAMUSCULAR | Status: DC | PRN
  Administered 2017-03-27: 50 ug via INTRAVENOUS
  Administered 2017-03-27: 100 ug via INTRAVENOUS

## 2017-03-27 MED ORDER — SUCCINYLCHOLINE CHLORIDE 200 MG/10ML IV SOSY
PREFILLED_SYRINGE | INTRAVENOUS | Status: DC | PRN
  Administered 2017-03-27: 40 mg via INTRAVENOUS

## 2017-03-27 MED ORDER — LACTATED RINGERS IV SOLN
INTRAVENOUS | Status: DC
  Administered 2017-03-27: 50 mL/h via INTRAVENOUS

## 2017-03-27 MED ORDER — LIDOCAINE 2% (20 MG/ML) 5 ML SYRINGE
INTRAMUSCULAR | Status: AC
  Filled 2017-03-27: qty 5

## 2017-03-27 MED ORDER — PROPOFOL 10 MG/ML IV BOLUS
INTRAVENOUS | Status: DC | PRN
  Administered 2017-03-27: 100 mg via INTRAVENOUS

## 2017-03-27 MED ORDER — ROCURONIUM BROMIDE 50 MG/5ML IV SOSY
PREFILLED_SYRINGE | INTRAVENOUS | Status: AC
  Filled 2017-03-27: qty 5

## 2017-03-27 MED ORDER — PHENYLEPHRINE 40 MCG/ML (10ML) SYRINGE FOR IV PUSH (FOR BLOOD PRESSURE SUPPORT)
PREFILLED_SYRINGE | INTRAVENOUS | Status: DC | PRN
  Administered 2017-03-27: 40 ug via INTRAVENOUS

## 2017-03-27 MED ORDER — 0.9 % SODIUM CHLORIDE (POUR BTL) OPTIME
TOPICAL | Status: DC | PRN
  Administered 2017-03-27: 1000 mL

## 2017-03-27 MED ORDER — ONDANSETRON HCL 4 MG/2ML IJ SOLN
INTRAMUSCULAR | Status: AC
  Filled 2017-03-27: qty 2

## 2017-03-27 MED ORDER — OXYMETAZOLINE HCL 0.05 % NA SOLN
NASAL | Status: AC
  Filled 2017-03-27: qty 15

## 2017-03-27 MED ORDER — PHENYLEPHRINE 40 MCG/ML (10ML) SYRINGE FOR IV PUSH (FOR BLOOD PRESSURE SUPPORT)
PREFILLED_SYRINGE | INTRAVENOUS | Status: AC
  Filled 2017-03-27: qty 10

## 2017-03-27 MED ORDER — LIDOCAINE 2% (20 MG/ML) 5 ML SYRINGE
INTRAMUSCULAR | Status: DC | PRN
  Administered 2017-03-27: 40 mg via INTRAVENOUS

## 2017-03-27 SURGICAL SUPPLY — 21 items
CANISTER SUCT 3000ML PPV (MISCELLANEOUS) ×3 IMPLANT
COVER BACK TABLE 60X90IN (DRAPES) ×3 IMPLANT
COVER MAYO STAND STRL (DRAPES) ×3 IMPLANT
CRADLE DONUT ADULT HEAD (MISCELLANEOUS) IMPLANT
DRAPE PROXIMA HALF (DRAPES) ×3 IMPLANT
GAUZE SPONGE 4X4 16PLY XRAY LF (GAUZE/BANDAGES/DRESSINGS) IMPLANT
GLOVE ECLIPSE 7.5 STRL STRAW (GLOVE) ×3 IMPLANT
GUARD TEETH (MISCELLANEOUS) IMPLANT
KIT BASIN OR (CUSTOM PROCEDURE TRAY) ×3 IMPLANT
KIT ROOM TURNOVER OR (KITS) ×3 IMPLANT
NDL HYPO 25GX1X1/2 BEV (NEEDLE) IMPLANT
NEEDLE HYPO 25GX1X1/2 BEV (NEEDLE) IMPLANT
NS IRRIG 1000ML POUR BTL (IV SOLUTION) ×3 IMPLANT
PAD ARMBOARD 7.5X6 YLW CONV (MISCELLANEOUS) ×6 IMPLANT
PATTIES SURGICAL .5 X3 (DISPOSABLE) IMPLANT
SOLUTION ANTI FOG 6CC (MISCELLANEOUS) IMPLANT
SPECIMEN JAR SMALL (MISCELLANEOUS) IMPLANT
SURGILUBE 2OZ TUBE FLIPTOP (MISCELLANEOUS) IMPLANT
TOWEL OR 17X24 6PK STRL BLUE (TOWEL DISPOSABLE) ×6 IMPLANT
TUBE CONNECTING 12'X1/4 (SUCTIONS) ×1
TUBE CONNECTING 12X1/4 (SUCTIONS) ×2 IMPLANT

## 2017-03-27 NOTE — Progress Notes (Signed)
Patient ID: Bryan Arellano, male   DOB: 06-21-1951, 66 y.o.   MRN: [id number]  PROGRESS NOTE    Bryan Arellano  IZT:[id number] DOB: 1951-01-29 DOA: 03/25/2017  PCP: Kandice Hams, MD   Brief Narrative:  66 y.o. 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 Patient notes that symptoms have intermittently been occurring off and on over the past 2 months. Symptoms had not caused him to stop eating until a day prior to the admission. He is found to have strep throat. He is seen by ENT in consultation.     Assessment & Plan:   Principal Problem:   Odynophagia and dysphagia in the setting of strep throat / Epiglottis ulcerative mass  - CT of soft tissue indicated that his swallowing difficulty may be from radiation changes but he has had RT back in 1999 - Pt had fiberoptic exam 3/28 and it showed epiglottis irregularly shaped with a exophytic ulcerative mass that is on the laryngeal surface of the epiglottis extending down toward the left side with exophytic tissue - Per ENT, he needs to have a direct laryngoscopy with biopsy to see if this indeed is a carcinoma.Total laryngectomy may be his only option.  - Started amoxicillin as he has strep throat  - Had 1 dose decadron on admission   Active Problems:   Mild dementia without behavioral disturbance  - Stable  - Continue Aricept     Dyslipidemia - Continue zocor    DVT prophylaxis: Lovenox subQ Code Status: full code  Family Communication: wife at the bedside this am Disposition Plan: needs direct laryngoscopy    Consultants:   ENT, Dr. Janace Hoard   Procedures:   None   Antimicrobials:   Amoxil 03/26/2017 -->  Subjective: No overnight events.  Objective: Vitals:   03/26/17 0644 03/26/17 1614 03/26/17 2139 03/27/17 0531    BP: (!) 159/75 117/68 (!) 141/94 117/77  Pulse: (!) 108 99 85 93  Resp: 18 (!) '24 18 18  '$ Temp: 97.9 F (36.6 C) 98.5 F (36.9 C) 97.9 F (36.6 C) 98.1 F (36.7 C)  TempSrc:  Oral Oral Oral  SpO2: 100% 98% 100% 100%  Weight:      Height:        Intake/Output Summary (Last 24 hours) at 03/27/17 1001 Last data filed at 03/26/17 1504  Gross per 24 hour  Intake           773.75 ml  Output                0 ml  Net           773.75 ml   Filed Weights   03/25/17 1617  Weight: 54 kg (119 lb)    Examination:  General exam: Appears calm and comfortable, no distress   Respiratory system: No wheezing, no rhonchi  Cardiovascular system: S1 & S2 heard, Rate controlled  Gastrointestinal system: Abdomen is not tender, no distended, (+) BS Central nervous system: No focal neurological deficits. Extremities: Symmetric 5 x 5 power. No edema. Skin: skin is warm and dry Psychiatry: normal mood and affect   Data Reviewed: I have personally reviewed following labs and imaging studies  CBC:  Recent Labs Lab 03/25/17 1858 03/26/17 0505 03/27/17 0618  WBC 9.3 6.2 9.1  NEUTROABS 7.3  --   --  HGB 13.2 14.3 13.3  HCT 40.0 43.0 39.1  MCV 93.0 92.9 91.4  PLT 238 228 379   Basic Metabolic Panel:  Recent Labs Lab 03/25/17 1858 03/26/17 0505 03/27/17 0618  NA 139 137 137  K 3.5 3.7 4.3  CL 103 102 104  CO2 26 21* 24  GLUCOSE 72 96 93  BUN '12 15 16  '$ CREATININE 0.91 1.04 0.90  CALCIUM 9.1 9.2 8.9   GFR: Estimated Creatinine Clearance: 60.8 mL/min (by C-G formula based on SCr of 0.9 mg/dL). Liver Function Tests:  Recent Labs Lab 03/25/17 1858  AST 19  ALT 11*  ALKPHOS 55  BILITOT 1.0  PROT 6.7  ALBUMIN 3.8   No results for input(s): LIPASE, AMYLASE in the last 168 hours. No results for input(s): AMMONIA in the last 168 hours. Coagulation Profile: No results for input(s): INR, PROTIME in the last 168 hours. Cardiac Enzymes: No results for input(s): CKTOTAL, CKMB,  CKMBINDEX, TROPONINI in the last 168 hours. BNP (last 3 results) No results for input(s): PROBNP in the last 8760 hours. HbA1C: No results for input(s): HGBA1C in the last 72 hours. CBG: No results for input(s): GLUCAP in the last 168 hours. Lipid Profile: No results for input(s): CHOL, HDL, LDLCALC, TRIG, CHOLHDL, LDLDIRECT in the last 72 hours. Thyroid Function Tests: No results for input(s): TSH, T4TOTAL, FREET4, T3FREE, THYROIDAB in the last 72 hours. Anemia Panel: No results for input(s): VITAMINB12, FOLATE, FERRITIN, TIBC, IRON, RETICCTPCT in the last 72 hours. Urine analysis:    Component Value Date/Time   COLORURINE YELLOW 12/26/2012 1400   APPEARANCEUR CLEAR 12/26/2012 1400   LABSPEC 1.025 12/26/2012 1400   PHURINE 7.0 12/26/2012 1400   GLUCOSEU NEGATIVE 12/26/2012 1400   HGBUR TRACE (A) 12/26/2012 1400   BILIRUBINUR NEGATIVE 12/26/2012 1400   KETONESUR NEGATIVE 12/26/2012 1400   PROTEINUR NEGATIVE 12/26/2012 1400   UROBILINOGEN 1.0 12/26/2012 1400   NITRITE NEGATIVE 12/26/2012 1400   LEUKOCYTESUR NEGATIVE 12/26/2012 1400   Sepsis Labs: '@LABRCNTIP'$ (procalcitonin:4,lacticidven:4)   Rapid strep screen     Status: None   Collection Time: 03/25/17  4:21 PM  Result Value Ref Range Status   Streptococcus, Group A Screen (Direct) NEGATIVE NEGATIVE Final    Comment: (NOTE) A Rapid Antigen test may result negative if the antigen level in the sample is below the detection level of this test. The FDA has not cleared this test as a stand-alone test therefore the rapid antigen negative result has reflexed to a Group A Strep culture.   Culture, group A strep     Status: None   Collection Time: 03/25/17  4:21 PM  Result Value Ref Range Status   Specimen Description THROAT  Final   Special Requests NONE Reflexed from [id number]  Final   Culture MODERATE STREPTOCOCCUS,BETA HEMOLYTIC NOT GROUP A  Final   Report Status 03/26/2017 FINAL  Final      Radiology Studies: Ct Soft  Tissue Neck W Contrast Result Date: 03/25/2017 Postradiation fibrosis and pharyngitis/laryngitis. Effaced airway airway at the level of the larynx may be transient. Suspected severe stenosis RIGHT internal carotid artery, similar to prior CT. Electronically Signed   By: Elon Alas M.D.   On: 03/25/2017 21:18     Scheduled Meds: . amoxicillin  250 mg Oral Q8H  . aspirin  81 mg Oral Daily  . donepezil  10 mg Oral Daily  . enoxaparin (LOVENOX) injection  40 mg Subcutaneous Q24H  . simvastatin  10 mg Oral QPM   Continuous  Infusions:    LOS: 0 days    Time spent: 25 minutes  Greater than 50% of the time spent on counseling and coordinating the care.   Leisa Lenz, MD [facility] Pager [phone]  If 7PM-7AM, please contact night-coverage [email or web address] Password [email or web address] 03/27/2017, 10:01 AM

## 2017-03-27 NOTE — Anesthesia Procedure Notes (Signed)
Procedure Name: Intubation Date/Time: 03/27/2017 11:38 AM Performed by: Garrison Columbus T Pre-anesthesia Checklist: Patient identified, Emergency Drugs available, Suction available and Patient being monitored Patient Re-evaluated:Patient Re-evaluated prior to inductionOxygen Delivery Method: Circle System Utilized Preoxygenation: Pre-oxygenation with 100% oxygen Intubation Type: IV induction Ventilation: Mask ventilation without difficulty Laryngoscope Size: Miller and 2 Grade View: Grade I Tube type: Oral Tube size: 6.0 mm Number of attempts: 1 Airway Equipment and Method: Stylet and Oral airway Placement Confirmation: ETT inserted through vocal cords under direct vision,  positive ETCO2 and breath sounds checked- equal and bilateral Secured at: 20 cm Tube secured with: Tape Dental Injury: Teeth and Oropharynx as per pre-operative assessment

## 2017-03-27 NOTE — Op Note (Signed)
Preop/postop diagnosis: Epiglottic lesion  Procedure: Direct laryngoscopy with biopsy and cervical esophagoscopy Anesthesia: Gen. Estimated blood loss: Less than 5 mL Indications: 66 year old with a previous squamous cell carcinoma the palate treated with radiation now with a lesion of his epiglottis that does have concern for squamous cell carcinoma. The wife and patient were informed risk and benefits of the procedure and options were discussed all questions are answered and consent was obtained. Operation: Patient was taken to the operating room placed in the supine position after general endotracheal tube anesthesia was placed in the rose position. Draped in usual sterile manner. The Dedo scope was inserted and the pharynx and larynx were examined. There was no lesions except on the epiglottis toward the left side. It was ulcerated and exophytic tissue that extended down onto the area epiglottic fold. The glottis looks normal. The piriforms are clear. Biopsy was taken from the lesion with cup forceps and then adrenaline soaked pledget was placed into the wound to gain good hemostasis. The cervical esophagoscope was inserted and the post cricoid in superior aspect the esophagus was examined. There was no evidence of any lesions. The patient was suctioned out of all blood and debris from the pharynx and there was good hemostasis. He was then awakened brought to recovery in stable condition counts correct

## 2017-03-27 NOTE — Transfer of Care (Signed)
Immediate Anesthesia Transfer of Care Note  Patient: Bryan Arellano  Procedure(s) Performed: Procedure(s): DIRECT LARYNGOSCOPY (N/A) ESOPHAGOSCOPY (N/A)  Patient Location: PACU  Anesthesia Type:General  Level of Consciousness: awake, alert , oriented and patient cooperative  Airway & Oxygen Therapy: Patient Spontanous Breathing and Patient connected to nasal cannula oxygen  Post-op Assessment: Report given to RN and Post -op Vital signs reviewed and stable  Post vital signs: Reviewed and stable  Last Vitals:  Vitals:   03/27/17 0531 03/27/17 1209  BP: 117/77   Pulse: 93   Resp: 18   Temp: 36.7 C (P) 36.7 C    Last Pain:  Vitals:   03/27/17 0531  TempSrc: Oral  PainSc:          Complications: No apparent anesthesia complications

## 2017-03-27 NOTE — Anesthesia Preprocedure Evaluation (Addendum)
Anesthesia Evaluation  Patient identified by MRN, date of birth, ID band Patient awake    Reviewed: Allergy & Precautions, NPO status , Patient's Chart, lab work & pertinent test results  Airway Mallampati: II  TM Distance: >3 FB Neck ROM: Full    Dental  (+) Dental Advisory Given, Edentulous Upper, Edentulous Lower   Pulmonary shortness of breath and with exertion, COPD, former smoker,    breath sounds clear to auscultation       Cardiovascular hypertension, + Past MI, + Peripheral Vascular Disease and +CHF  + dysrhythmias Atrial Fibrillation  Rhythm:Regular     Neuro/Psych    GI/Hepatic GERD  ,  Endo/Other  Hypothyroidism   Renal/GU CRFRenal disease     Musculoskeletal   Abdominal   Peds  Hematology negative hematology ROS (+)   Anesthesia Other Findings   Reproductive/Obstetrics                           Anesthesia Physical Anesthesia Plan  ASA: III  Anesthesia Plan: General   Post-op Pain Management:    Induction: Intravenous  Airway Management Planned: Oral ETT  Additional Equipment: None  Intra-op Plan:   Post-operative Plan: Extubation in OR  Informed Consent: I have reviewed the patients History and Physical, chart, labs and discussed the procedure including the risks, benefits and alternatives for the proposed anesthesia with the patient or authorized representative who has indicated his/her understanding and acceptance.   Dental advisory given  Plan Discussed with: CRNA, Anesthesiologist and Surgeon  Anesthesia Plan Comments:       Anesthesia Quick Evaluation

## 2017-03-28 ENCOUNTER — Encounter (HOSPITAL_COMMUNITY): Payer: Self-pay | Admitting: Otolaryngology

## 2017-03-28 DIAGNOSIS — R1310 Dysphagia, unspecified: Secondary | ICD-10-CM | POA: Diagnosis not present

## 2017-03-28 DIAGNOSIS — E785 Hyperlipidemia, unspecified: Secondary | ICD-10-CM

## 2017-03-28 DIAGNOSIS — F0390 Unspecified dementia without behavioral disturbance: Secondary | ICD-10-CM | POA: Diagnosis not present

## 2017-03-28 MED ORDER — ACETAMINOPHEN 325 MG PO TABS: 650.0000 mg | ORAL_TABLET | Freq: Four times a day (QID) | ORAL | 0 refills | Status: DC | PRN

## 2017-03-28 MED ORDER — AMOXICILLIN 250 MG/5ML PO SUSR: 250.0000 mg | Freq: Three times a day (TID) | ORAL | 0 refills | Status: AC

## 2017-03-28 MED ORDER — HYDROCODONE-ACETAMINOPHEN 5-325 MG PO TABS: 1.0000 | ORAL_TABLET | Freq: Four times a day (QID) | ORAL | 0 refills | Status: DC | PRN

## 2017-03-28 NOTE — Discharge Summary (Signed)
Physician Discharge Summary  Bryan Arellano JKD:[id number] DOB: 12-23-1951 DOA: 03/25/2017  PCP: Kandice Hams, MD  Admit date: 03/25/2017 Discharge date: 03/28/2017  Recommendations for Outpatient Follow-up:  Please continue amoxil for 6 days. Follow up with PCP and ENT when appointment is scheduled.  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 Patient notes that symptoms have intermittently been occurring off and on over the past 2 months. Symptoms had not caused him to stop eating until a day prior to the admission. He was found to have strep throat. He is seen by ENT in consultation.     Hospital Course:  Principal Problem:   Odynophagia and dysphagia in the setting of strep throat / Epiglottis ulcerative mass  - CT of soft tissue indicated that his swallowing difficulty may be from radiation changes but he has had RT back in 1999 - Pt had fiberoptic exam 3/28 and it showed epiglottis irregularly shaped with a exophytic ulcerative mass that is on the laryngeal surface of the epiglottis extending down toward the left side with exophytic tissue - S/P direct laryngoscopy 3/29 - There was no lesions except on the epiglottis toward the left side. It was ulcerated exophytic tissue that extended down onto the area epiglottic fold. The glottis looked normal. The piriforms were clear. Biopsy was taken from the lesion. - Started amoxicillin as he has strep throat which he will continue for 6 days on discahrge   - Had 1 dose decadron on admission   Active Problems:   Mild dementia without behavioral disturbance  - Stable  - Continue Aricept     Dyslipidemia - Continue zocor    DVT prophylaxis: Lovenox subQ Code Status: full code  Family Communication: wife at the bedside this am   Consultants:   ENT, Dr. Janace Hoard   Procedures:   None   Antimicrobials:   Amoxil 03/26/2017 --> for 6 days on discahrge   Signed:  Leisa Lenz, MD  [facility] 03/28/2017, 8:46 AM  Pager #: [phone]  Time spent in minutes: less than 30 minutes    Discharge Exam: Vitals:   03/28/17 0200 03/28/17 0722  BP: 110/72 123/68  Pulse: 79 72  Resp:  18  Temp:  99.6 F (37.6 C)   Vitals:   03/27/17 1341 03/27/17 2100 03/28/17 0200 03/28/17 0722  BP: 111/70 (!) 96/58 110/72 123/68  Pulse: 62 79 79 72  Resp: 15   18  Temp: 98.2 F (36.8 C) 98.9 F (37.2 C)  99.6 F (37.6 C)  TempSrc: Oral Oral  Oral  SpO2: 100% 96% 98% 97%  Weight:      Height:        General: Pt is alert, not in acute distress Cardiovascular: Regular rate and rhythm, S1/S2 + Respiratory: Clear to auscultation bilaterally, no wheezing, no crackles, no rhonchi Abdominal: Soft, non tender, non distended, bowel sounds +, no guarding Extremities: no edema, no cyanosis, pulses palpable bilaterally DP and PT Neuro: Grossly nonfocal  Discharge Instructions  Discharge Instructions    Call MD for:  persistant nausea and vomiting    Complete by:  As directed    Call MD for:  redness, tenderness, or signs of infection (pain, swelling, redness, odor or green/yellow discharge around incision site)    Complete by:  As directed    Call MD for:  severe uncontrolled pain    Complete by:  As directed    Diet - low sodium heart healthy    Complete by:  As directed    Discharge instructions    Complete by:  As directed    Please continue amoxil for 6 days. Follow up with PCP and ENT when appointment is  scheduled.   Increase activity slowly    Complete by:  As directed      Allergies as of 03/28/2017   No Known Allergies     Medication List    TAKE these medications   acetaminophen 325 MG tablet Commonly known as:  TYLENOL Take 2 tablets (650 mg total) by mouth every 6 (six) hours as needed for mild pain (or Fever >/= 101).   amoxicillin 250 MG/5ML suspension Commonly known as:  AMOXIL Take 5 mLs (250 mg total) by mouth every 8 (eight) hours.   aspirin 81 MG tablet Take 81 mg by mouth daily.   donepezil 10 MG tablet Commonly known as:  ARICEPT Take 1 tablet (10 mg total) by mouth daily. midday   HYDROcodone-acetaminophen 5-325 MG tablet Commonly known as:  NORCO Take 1 tablet by mouth every 6 (six) hours as needed for moderate pain.   simvastatin 10 MG tablet Commonly known as:  ZOCOR Take 1 tablet (10 mg total) by mouth every evening.      Follow-up Information    POLITE,RONALD D, MD. Schedule an appointment as soon as possible for a visit in 1 week(s).   Specialty:  Internal Medicine Contact information: [address] [place] [address] [phone]            The results of significant diagnostics from this hospitalization (including imaging, microbiology, ancillary and laboratory) are listed below for reference.    Significant Diagnostic Studies: Ct Soft Tissue Neck W Contrast  Result Date: 03/25/2017 CLINICAL DATA:  Dysphagia and voice changes. History of head and neck cancer, carotid artery occlusion. EXAM: CT NECK WITH CONTRAST TECHNIQUE: Multidetector CT imaging of the neck was performed using the standard protocol following the bolus administration of intravenous contrast. CONTRAST:  75 cc ISOVUE-300 IOPAMIDOL (ISOVUE-300) INJECTION 61% COMPARISON:  CT neck February 16, 2013 FINDINGS: PHARYNX AND LARYNX: Thickened, edematous epiglottis extending to the glossoepiglottic and glossopharyngeal folds. Thickened bilateral aryepiglottic  folds, thickened edematous larynx. Minimal pooling secretions in the hypopharynx with debris. Effaced airway at the level of the larynx. SALIVARY GLANDS: Atrophic major salivary glands. THYROID: Normal. LYMPH NODES: No lymphadenopathy by CT size criteria. VASCULAR: Severe atherosclerosis. Chronically occluded LEFT external carotid artery. Severe stenosis RIGHT internal carotid artery origin. Patent though narrowed bilateral internal jugular veins. LIMITED INTRACRANIAL: Normal. VISUALIZED ORBITS: Normal. MASTOIDS AND VISUALIZED PARANASAL SINUSES: Lobulated bilateral maxillary sinus probable polyps, versus mucosal retention cyst. SKELETON: Nonacute. No destructive bony lesions. Old mildly depressed bilateral nasal bone fractures. Patient is edentulous. UPPER CHEST: Lung apices are clear, centrilobular emphysema. Subcentimeter precarinal lymph node. OTHER: Postradiation changes of the anterior neck. IMPRESSION: Postradiation fibrosis and pharyngitis/laryngitis. Effaced airway airway at the level of the larynx may be transient. Suspected severe stenosis RIGHT internal carotid artery, similar to prior CT. Electronically Signed   By: Elon Alas  M.D.   On: 03/25/2017 21:18    Microbiology: Recent Results (from the past 240 hour(s))  Rapid strep screen     Status: None   Collection Time: 03/25/17  4:21 PM  Result Value Ref Range Status   Streptococcus, Group A Screen (Direct) NEGATIVE NEGATIVE Final    Comment: (NOTE) A Rapid Antigen test may result negative if the antigen level in the sample is below the detection level of this test. The FDA has not cleared this test as a stand-alone test therefore the rapid antigen negative result has reflexed to a Group A Strep culture.   Culture, group A strep     Status: None   Collection Time: 03/25/17  4:21 PM  Result Value Ref Range Status   Specimen Description THROAT  Final   Special Requests NONE Reflexed from [phone]  Final   Culture MODERATE  STREPTOCOCCUS,BETA HEMOLYTIC NOT GROUP A  Final   Report Status 03/26/2017 FINAL  Final     Labs: Basic Metabolic Panel:  Recent Labs Lab 03/25/17 1858 03/26/17 0505 03/27/17 0618  NA 139 137 137  K 3.5 3.7 4.3  CL 103 102 104  CO2 26 21* 24  GLUCOSE 72 96 93  BUN '12 15 16  '$ CREATININE 0.91 1.04 0.90  CALCIUM 9.1 9.2 8.9   Liver Function Tests:  Recent Labs Lab 03/25/17 1858  AST 19  ALT 11*  ALKPHOS 55  BILITOT 1.0  PROT 6.7  ALBUMIN 3.8   No results for input(s): LIPASE, AMYLASE in the last 168 hours. No results for input(s): AMMONIA in the last 168 hours. CBC:  Recent Labs Lab 03/25/17 1858 03/26/17 0505 03/27/17 0618  WBC 9.3 6.2 9.1  NEUTROABS 7.3  --   --   HGB 13.2 14.3 13.3  HCT 40.0 43.0 39.1  MCV 93.0 92.9 91.4  PLT 238 228 210   Cardiac Enzymes: No results for input(s): CKTOTAL, CKMB, CKMBINDEX, TROPONINI in the last 168 hours. BNP: BNP (last 3 results) No results for input(s): BNP in the last 8760 hours.  ProBNP (last 3 results) No results for input(s): PROBNP in the last 8760 hours.  CBG: No results for input(s): GLUCAP in the last 168 hours.

## 2017-03-28 NOTE — Progress Notes (Signed)
Bryan Arellano to be D/C'd to home per MD order.  Discussed with the patient and all questions fully answered. Pt. Wife anxious to get out the door and rushing to leave.  VSS, Skin clean, dry and intact without evidence of skin break down, no evidence of skin tears noted. IV catheter discontinued intact. Site without signs and symptoms of complications. Dressing and pressure applied.  An After Visit Summary was printed and given to the patient. Patient received prescriptions.  D/c education completed with patient/family including follow up instructions, medication list, d/c activities limitations if indicated, with other d/c instructions as indicated by MD - patient able to verbalize understanding, all questions fully answered.   Patient instructed to return to ED, call 911, or call MD for any changes in condition.   Patient escorted via [place], and D/C home via private auto.  Lexandra Rettke L Price 03/28/2017 10:02 AM

## 2017-03-28 NOTE — Discharge Instructions (Addendum)

## 2017-03-28 NOTE — Progress Notes (Signed)
Pt wife wants to take pt home now. Asked to prepare discharge.  Leisa Lenz [facility] [phone]

## 2017-03-28 NOTE — Anesthesia Postprocedure Evaluation (Addendum)
Anesthesia Post Note  Patient: Trason Shifflet  Procedure(s) Performed: Procedure(s) (LRB): DIRECT LARYNGOSCOPY (N/A) ESOPHAGOSCOPY (N/A)  Patient location during evaluation: PACU Anesthesia Type: General Level of consciousness: awake Pain management: pain level controlled Vital Signs Assessment: post-procedure vital signs reviewed and stable Respiratory status: spontaneous breathing, nonlabored ventilation, respiratory function stable and patient connected to nasal cannula oxygen Cardiovascular status: blood pressure returned to baseline and stable Postop Assessment: no signs of nausea or vomiting Anesthetic complications: no       Last Vitals:  Vitals:   03/28/17 0200 03/28/17 0722  BP: 110/72 123/68  Pulse: 79 72  Resp:  18  Temp:  37.6 C    Last Pain:  Vitals:   03/28/17 0722  TempSrc: Oral  PainSc:                  Oliviagrace Crisanti

## 2017-06-02 NOTE — Addendum Note (Signed)
Addendum  created 06/02/17 1257 by Oleta Mouse, MD   Sign clinical note

## 2017-06-30 ENCOUNTER — Encounter: Payer: Self-pay | Admitting: Vascular Surgery

## 2017-07-10 ENCOUNTER — Inpatient Hospital Stay (HOSPITAL_COMMUNITY): Admission: RE | Admit: 2017-07-10 | Payer: Medicare Other | Source: Ambulatory Visit

## 2017-07-10 ENCOUNTER — Ambulatory Visit: Payer: Medicare Other | Admitting: Vascular Surgery

## 2017-09-18 ENCOUNTER — Other Ambulatory Visit: Payer: Self-pay | Admitting: Internal Medicine

## 2017-09-18 ENCOUNTER — Ambulatory Visit
Admission: RE | Admit: 2017-09-18 | Discharge: 2017-09-18 | Disposition: A | Discharge status: Home / Self Care | Payer: Medicare Other | Source: Ambulatory Visit | Attending: Internal Medicine | Admitting: Internal Medicine

## 2017-09-18 DIAGNOSIS — R05 Cough: Secondary | ICD-10-CM

## 2017-09-18 DIAGNOSIS — R059 Cough, unspecified: Secondary | ICD-10-CM

## 2018-03-24 ENCOUNTER — Encounter: Payer: Self-pay | Admitting: Neurology

## 2018-03-24 ENCOUNTER — Ambulatory Visit (INDEPENDENT_AMBULATORY_CARE_PROVIDER_SITE_OTHER): Payer: Medicare Other | Admitting: Neurology

## 2018-03-24 ENCOUNTER — Other Ambulatory Visit: Payer: Self-pay

## 2018-03-24 VITALS — BP 138/62 | HR 112 | Wt 135.0 lb

## 2018-03-24 DIAGNOSIS — F0391 Unspecified dementia with behavioral disturbance: Secondary | ICD-10-CM | POA: Diagnosis not present

## 2018-03-24 DIAGNOSIS — F03B18 Unspecified dementia, moderate, with other behavioral disturbance: Secondary | ICD-10-CM

## 2018-03-24 MED ORDER — DONEPEZIL HCL 10 MG PO TABS: 10.0000 mg | ORAL_TABLET | Freq: Every day | ORAL | 3 refills | Status: DC

## 2018-03-24 MED ORDER — DIVALPROEX SODIUM ER 250 MG PO TB24: ORAL_TABLET | ORAL | 6 refills | Status: DC

## 2018-03-24 NOTE — Patient Instructions (Signed)
1. Start Depakote ER 250mg : Take 1 tablet every night 2. Continue Donepezil 10mg  daily 3. Follow-up in 6 months, call for any changes  FALL PRECAUTIONS: Be cautious when walking. Scan the area for obstacles that may increase the risk of trips and falls. When getting up in the mornings, sit up at the edge of the bed for a few minutes before getting out of bed. Consider elevating the bed at the head end to avoid drop of blood pressure when getting up. Walk always in a well-lit room (use night lights in the walls). Avoid area rugs or power cords from appliances in the middle of the walkways. Use a walker or a cane if necessary and consider physical therapy for balance exercise. Get your eyesight checked regularly.  FINANCIAL OVERSIGHT: Supervision, especially oversight when making financial decisions or transactions is also recommended.  HOME SAFETY: Consider the safety of the kitchen when operating appliances like stoves, microwave oven, and blender. Consider having supervision and share cooking responsibilities until no longer able to participate in those. Accidents with firearms and other hazards in the house should be identified and addressed as well.  DRIVING: Regarding driving, in patients with progressive memory problems, driving will be impaired. We advise to have someone else do the driving if trouble finding directions or if minor accidents are reported. Independent driving assessment is available to determine safety of driving.  ABILITY TO BE LEFT ALONE: If patient is unable to contact 911 operator, consider using LifeLine, or when the need is there, arrange for someone to stay with patients. Smoking is a fire hazard, consider supervision or cessation. Risk of wandering should be assessed by caregiver and if detected at any point, supervision and safe proof recommendations should be instituted.  MEDICATION SUPERVISION: Inability to self-administer medication needs to be constantly addressed.  Implement a mechanism to ensure safe administration of the medications.  RECOMMENDATIONS FOR ALL PATIENTS WITH MEMORY PROBLEMS: 1. Continue to exercise (Recommend 30 minutes of walking everyday, or 3 hours every week) 2. Increase social interactions - continue going to [place] and enjoy social gatherings with friends and family 3. Eat healthy, avoid fried foods and eat more fruits and vegetables 4. Maintain adequate blood pressure, blood sugar, and blood cholesterol level. Reducing the risk of stroke and cardiovascular disease also helps promoting better memory. 5. Avoid stressful situations. Live a simple life and avoid aggravations. Organize your time and prepare for the next day in anticipation. 6. Sleep well, avoid any interruptions of sleep and avoid any distractions in the bedroom that may interfere with adequate sleep quality 7. Avoid sugar, avoid sweets as there is a strong link between excessive sugar intake, diabetes, and cognitive impairment We discussed the Mediterranean diet, which has been shown to help patients reduce the risk of progressive memory disorders and reduces cardiovascular risk. This includes eating fish, eat fruits and green leafy vegetables, nuts like almonds and hazelnuts, walnuts, and also use olive oil. Avoid fast foods and fried foods as much as possible. Avoid sweets and sugar as sugar use has been linked to worsening of memory function.  There is always a concern of gradual progression of memory problems. If this is the case, then we may need to adjust level of care according to patient needs. Support, both to the patient and caregiver, should then be put into place.

## 2018-03-24 NOTE — Progress Notes (Signed)
NEUROLOGY FOLLOW UP OFFICE NOTE  Bryan Arellano [id number]  DOB: Jun 23, 1951  HISTORY OF PRESENT ILLNESS: I had the pleasure of seeing Bryan Arellano in follow-up in the neurology clinic on 03/24/2018.  The patient was last seen a year ago for mild dementia. He is again accompanied by his wife who helps supplement the history today. MMSE in March 2018 was 21/30 (22/30 in March 2017, 19/30 in September 2016). He is taking Aricept 10mg  daily without side effects. 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 He refuses to use the speaking valve. His wife reports that he has always been quiet, but now that he cannot talk, this frustrates him. His memory is worsening. She reports they were homeless for 7 months, staying with their children. He would go outside and stand outside, unsure which apartment to go back in to. He wanders at night, she has put deadbolts on the door and thinks he would go out if there were no locks. She helps bathe him. He is able to dress himself and feeds himself. She is noticing more behavioral changes. He grabs a bunch of straws and utensils when they go to a fastfood place. He has several forks in his breast pocket today. He was moody for 2 weeks after his hospitalization. Sometimes he looks around like he sees something, but when his wife asks, he shakes his head. He denies any falls, no headaches, dizziness, diplopia, focal numbness/tingling/weakness. No falls.  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 He reports he forgets names but denies other difficulties. His wife reports that he regularly misplaces things at home. He would ask the same questions repeatedly. She denies any difficulties following instructions, no  missed medications. He does not drive. His wife has always been in charge of bills. She denies any personality changes, no hallucinations. His father and sister were diagnosed with Alzheimer's disease.  Diagnostic Data: MRI brain with and without contrast 11/2014 showed mild diffuse atrophy, minimal chronic microvascular disease, no acute changes.  PAST MEDICAL HISTORY: Past Medical History:  Diagnosis Date  . Acute MI ([place]) 09/25/2002   hx/notes 05/15/2011  . Atrial fibrillation ([place])    hx/notes 05/15/2011  . Carotid artery occlusion   . CHF (congestive heart failure) ([place]) 09/25/2002   hx/notes 05/15/2011  . Chronic renal failure    hx/notes 05/15/2011  . COPD (chronic obstructive pulmonary disease) ([place])    hx/notes 05/15/2011  . GERD (gastroesophageal reflux disease)    hx/notes 05/15/2011  . History of radiation therapy 1999  . Hypertension    hx/notes 05/15/2011  . Hypothyroidism    hx/notes 05/15/2011  . Internal carotid artery stenosis, right    Bryan Arellano 03/25/2017  . Iron deficiency anemia    hx/notes 05/15/2011  . Lung cancer ([place])    hx/notes 05/15/2011  . Pain around PEG tube site   . Prostate cancer ([place])    hx/notes 05/15/2011  . PVD (peripheral vascular disease) ([place])    hx/notes 05/15/2011  . 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 aspirin 81 MG tablet Take 81 mg by mouth daily.    Marland Kitchen donepezil (  ARICEPT) 10 MG tablet Take 1 tablet (10 mg total) by mouth daily. midday 90 tablet 3  . HYDROcodone-acetaminophen (NORCO) 5-325 MG tablet Take 1 tablet by mouth every 6 (six) hours as needed for moderate pain. 30 tablet 0  . levothyroxine (SYNTHROID, LEVOTHROID) 50 MCG tablet Take by mouth.    . simvastatin (ZOCOR) 10 MG tablet Take 1 tablet (10 mg total) by mouth every evening. 30 tablet 0  . acetaminophen (TYLENOL)  325 MG tablet Take 2 tablets (650 mg total) by mouth every 6 (six) hours as needed for mild pain (or Fever >/= 101). (Patient not taking: Reported on 03/24/2018) 30 tablet 0   No current facility-administered medications on file prior to visit.     ALLERGIES: No Known Allergies  FAMILY HISTORY: Family History  Problem Relation Age of Onset  . Heart disease Sister        before age 42  . Clotting disorder Sister   . Cancer Brother   . Heart disease Brother   . Heart attack Brother     SOCIAL HISTORY: Social History   Socioeconomic History  . Marital status: Married    Spouse name: Not on file  . Number of children: Not on file  . Years of education: Not on file  . Highest education level: Not on file  Occupational History  . Not on file  Social Needs  . Financial resource strain: Not on file  . Food insecurity:    Worry: Not on file    Inability: Not on file  . Transportation needs:    Medical: Not on file    Non-medical: Not on file  Tobacco Use  . Smoking status: Former Smoker    Types: Cigarettes    Last attempt to quit: 04/18/1998    Years since quitting: 19.9  . Smokeless tobacco: Never Used  Substance and Sexual Activity  . Alcohol use: No    Alcohol/week: 0.0 oz    Comment: pt's wife states that he quit drinking in 1997, pt use to drink about 80 beers per week and 900 shots of liquor per week  . Drug use: No  . Sexual activity: Not on file  Lifestyle  . Physical activity:    Days per week: Not on file    Minutes per session: Not on file  . Stress: Not on file  Relationships  . Social connections:    Talks on phone: Not on file    Gets together: Not on file    Attends religious service: Not on file    Active member of club or organization: Not on file    Attends meetings of clubs or organizations: Not on file    Relationship status: Not on file  . Intimate partner violence:    Fear of current or ex partner: Not on file    Emotionally abused: Not on  file    Physically abused: Not on file    Forced sexual activity: Not on file  Other Topics Concern  . Not on file  Social History Narrative  . Not on file    REVIEW OF SYSTEMS: Constitutional: No fevers, chills, or sweats, no generalized fatigue, change in appetite Eyes: No visual changes, double vision, eye pain Ear, nose and throat: No hearing loss, ear pain, nasal congestion, sore throat Cardiovascular: No chest pain, palpitations Respiratory:  No shortness of breath at rest or with exertion, wheezes GastrointestinaI: No nausea, vomiting, diarrhea, abdominal pain, fecal incontinence Genitourinary:  No dysuria, urinary  retention or frequency Musculoskeletal:  +neck pain, no back pain Integumentary: No rash, pruritus, skin lesions Neurological: as above Psychiatric: No depression, insomnia, anxiety Endocrine: No palpitations, fatigue, diaphoresis, mood swings, change in appetite, change in weight, increased thirst Hematologic/Lymphatic:  No anemia, purpura, petechiae. Allergic/Immunologic: no itchy/runny eyes, nasal congestion, recent allergic reactions, rashes  PHYSICAL EXAM: Vitals:   03/24/18 1006  BP: 138/62  Pulse: (!) 112  SpO2: 98%   General: No acute distress Head:  Normocephalic/atraumatic Neck: supple, no paraspinal tenderness, full range of motion. S/p total laryngectomy with clean stoma on neck Heart:  Regular rate and rhythm Lungs:  Clear to auscultation bilaterally Back: No paraspinal tenderness Skin/Extremities: No rash, no edema Neurological Exam: alert and oriented to person, place. No aphasia, speaks in whispers. Fund of knowledge is reduced. Recent and remote memory impaired. Attention and concentration are normal. Able to name objects and repeat phrases. Clock drawing 4/5 (better than prior) MMSE - Mini Mental State Exam 03/24/2018 03/24/2017 03/19/2016 09/01/2015 11/30/2014  Orientation to time 0 0 1 0 1  Orientation to Place 0 4 4 2 3   Registration 3 3  3 3 3   Attention/ Calculation 2 5 5 5 4   Recall 0 0 0 0 0  Language- name 2 objects 2 2 2 2 2   Language- repeat 1 1 1 1 1   Language- follow 3 step command 3 3 3 3 3   Language- read & follow direction 1 1 1 1 1   Write a sentence 1 1 1 1 1   Copy design 1 1 1 1 1   Total score 14 21 22 19 20    Cranial nerves: Pupils equal, round, reactive to light. Extraocular movements intact with no nystagmus. Visual fields full. Facial sensation intact. No facial asymmetry. Tongue, uvula, palate midline. Motor: Bulk and tone normal, muscle strength 5/5 throughout with no pronator drift. Sensation to light touch intact. No extinction to double simultaneous stimulation. Deep tendon reflexes 2+ throughout, toes downgoing. Finger to nose testing intact. Gait narrow-based and steady, able to tandem walk adequately. Romberg negative.  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 MMSE today is 14/30 (21/30 in March 2018, 22/30 in March 2017, 19/30 in September 2016, 20/30 in December 2015). MRI brain unremarkable. Continue daily Aricept 10mg . His wife declines adding on Namenda. He now has behavioral changes with dementia, with wandering behavior, ?hallucinations, irritability. We discussed starting low dose Depakote ER 250mg  qhs for mood stabilization. He does not drive. We again discussed the importance of physical and brain stimulation exercises, as well as control of vascular risk factors for brain health were also discussed. He will follow-up in 6 months and knows to call our office for any changes.  Thank you for allowing me to participate in his care.  Please do not hesitate to call for any questions or concerns.  The duration of this appointment visit was 26  minutes of face-to-face time with the patient.  Greater than 50% of this time was spent in counseling, explanation of diagnosis, planning of further management, and coordination of care.   Ellouise Newer, M.D.   CC: Dr. Delfina Redwood

## 2018-03-29 ENCOUNTER — Other Ambulatory Visit: Payer: Self-pay | Admitting: Neurology

## 2018-03-29 DIAGNOSIS — F0391 Unspecified dementia with behavioral disturbance: Secondary | ICD-10-CM

## 2018-03-29 DIAGNOSIS — F03B18 Unspecified dementia, moderate, with other behavioral disturbance: Secondary | ICD-10-CM

## 2018-09-29 ENCOUNTER — Ambulatory Visit: Payer: Medicare Other | Admitting: Neurology

## 2018-11-19 ENCOUNTER — Other Ambulatory Visit: Payer: Self-pay

## 2018-11-19 NOTE — Patient Outreach (Signed)
[facility]) Care Management  11/19/2018  Kellie Chisolm 15-Sep-1951 [id number]   Referral Date: 11/18/18 Referral Source: MD referral Referral Reason: Memory loss/ care resources  Outreach Attempt:spoke with wife Medford Staheli.  She reports that patient has memory loss/dementia and it is getting worse.  She states she does not want to place patient in a facility but is looking for some help with any care resources, day programs, and any other care options.  She states that patient rambles through things in the home.  She is his primary caregiver and states that patient is dependent with all aspects of care.  She states that patient ambulates independently without any assistive devices.  She states that patient does not speak as he has a trach from previous head and neck cancer.  She states that patient does well with his breathing and requires minimal help with that.    Discussed [facility] services and how we can support her and patient.  She is agreeable to social work assistance at this time to explore resources for memory loss/dementia.     Plan: RN CM will refer to social work for resources for memory loss/dementia.   Jone Baseman, RN, MSN [facility] Care Management Care Management Coordinator Direct Line [phone] Toll Free: [phone]  Fax: [phone]

## 2018-11-23 ENCOUNTER — Other Ambulatory Visit: Payer: Self-pay

## 2018-11-23 NOTE — Patient Outreach (Signed)
[facility]) Care Management  11/23/2018  Lorence Nagengast [birth year] [id number]  Successful outreach to the patients wife, Amal Saiki, as indicated on the patients referral due to patients cognitive status. Mrs. Mayeda is able to provide patient HIPAA identifiers. BSW introduced self to Mrs. Ruest and the reason for today's call, indicating the patient had been referred for assistance in caregiver respite options. The patient lives in the home with his wife. The patient is ambulatory and needs minimal assist and cuing with ADL's. The patient is non-verbal and has a history of mouth cancer.    It is reported the patient has had a diagnosis of memory loss for approximately two years "and he just keeps getting worse". BSW acknowledged Mrs. Vanhecke concerns and need for time to "re-charge" as she is the patients primary caregiver. It is reported the patient has coverage under both [facility] and Medicaid. BSW discussed options of an in-home PCS worker or the patient attending an adult day program. Mrs. Teschner indicated she would like the patient to attend a day program which would provide the patient with engagement and allow Mrs. Mannan time without the patient.  BSW briefly discussed options including [facility] and [facility] day program. Mrs. Croft prefers to choose a program that will accept the patients Medicaid as payment but states "if we have to pay for it we can". Mrs. Ayoub does not indicate concern over switching providers under the [facility] model but does not seem agreeable to [facility] navigating the patients care. Mrs. Belt is agreeable to BSW mailing information and brochures on both program previously mentioned to the patients home address.   Plan: BSW to outreach Mrs. Capelle in the next three weeks to confirm receipt of resources. BSW will assist with linking to desired program as needed. BSW has provided Mrs. Loe with this BSW's direct contact  information in the event assistance is needed prior to the next scheduled call.  Daneen Schick, BSW, CDP [facility] [phone]

## 2018-12-12 ENCOUNTER — Emergency Department (HOSPITAL_COMMUNITY): Payer: Medicare Other

## 2018-12-12 ENCOUNTER — Emergency Department (HOSPITAL_COMMUNITY)
Admission: EM | Admit: 2018-12-12 | Discharge: 2018-12-12 | Disposition: A | Discharge status: Home / Self Care | Payer: Medicare Other | Attending: Emergency Medicine | Admitting: Emergency Medicine

## 2018-12-12 ENCOUNTER — Encounter (HOSPITAL_COMMUNITY): Payer: Self-pay | Admitting: Nurse Practitioner

## 2018-12-12 DIAGNOSIS — I130 Hypertensive heart and chronic kidney disease with heart failure and stage 1 through stage 4 chronic kidney disease, or unspecified chronic kidney disease: Secondary | ICD-10-CM | POA: Diagnosis not present

## 2018-12-12 DIAGNOSIS — Z85828 Personal history of other malignant neoplasm of skin: Secondary | ICD-10-CM | POA: Insufficient documentation

## 2018-12-12 DIAGNOSIS — Z87891 Personal history of nicotine dependence: Secondary | ICD-10-CM | POA: Insufficient documentation

## 2018-12-12 DIAGNOSIS — R5381 Other malaise: Secondary | ICD-10-CM | POA: Diagnosis present

## 2018-12-12 DIAGNOSIS — N3001 Acute cystitis with hematuria: Secondary | ICD-10-CM | POA: Insufficient documentation

## 2018-12-12 DIAGNOSIS — Z79899 Other long term (current) drug therapy: Secondary | ICD-10-CM | POA: Insufficient documentation

## 2018-12-12 DIAGNOSIS — N189 Chronic kidney disease, unspecified: Secondary | ICD-10-CM | POA: Diagnosis not present

## 2018-12-12 DIAGNOSIS — E039 Hypothyroidism, unspecified: Secondary | ICD-10-CM | POA: Insufficient documentation

## 2018-12-12 DIAGNOSIS — Z85118 Personal history of other malignant neoplasm of bronchus and lung: Secondary | ICD-10-CM | POA: Diagnosis not present

## 2018-12-12 DIAGNOSIS — J449 Chronic obstructive pulmonary disease, unspecified: Secondary | ICD-10-CM | POA: Diagnosis not present

## 2018-12-12 DIAGNOSIS — I509 Heart failure, unspecified: Secondary | ICD-10-CM | POA: Diagnosis not present

## 2018-12-12 DIAGNOSIS — Z7982 Long term (current) use of aspirin: Secondary | ICD-10-CM | POA: Diagnosis not present

## 2018-12-12 LAB — URINALYSIS, ROUTINE W REFLEX MICROSCOPIC
BILIRUBIN URINE: NEGATIVE
Glucose, UA: NEGATIVE mg/dL
Ketones, ur: 20 mg/dL — AB
NITRITE: POSITIVE — AB
PH: 8 (ref 5.0–8.0)
Protein, ur: 30 mg/dL — AB
SPECIFIC GRAVITY, URINE: 1.016 (ref 1.005–1.030)

## 2018-12-12 LAB — CBC WITH DIFFERENTIAL/PLATELET
Abs Immature Granulocytes: 0.03 10*3/uL (ref 0.00–0.07)
BASOS ABS: 0 10*3/uL (ref 0.0–0.1)
Basophils Relative: 1 %
EOS PCT: 1 %
Eosinophils Absolute: 0.1 10*3/uL (ref 0.0–0.5)
HEMATOCRIT: 47.8 % (ref 39.0–52.0)
HEMOGLOBIN: 15.9 g/dL (ref 13.0–17.0)
Immature Granulocytes: 0 %
LYMPHS ABS: 1.6 10*3/uL (ref 0.7–4.0)
Lymphocytes Relative: 18 %
MCH: 31.5 pg (ref 26.0–34.0)
MCHC: 33.3 g/dL (ref 30.0–36.0)
MCV: 94.7 fL (ref 80.0–100.0)
MONO ABS: 0.7 10*3/uL (ref 0.1–1.0)
Monocytes Relative: 8 %
NRBC: 0 % (ref 0.0–0.2)
Neutro Abs: 6.5 10*3/uL (ref 1.7–7.7)
Neutrophils Relative %: 72 %
Platelets: 265 10*3/uL (ref 150–400)
RBC: 5.05 MIL/uL (ref 4.22–5.81)
RDW: 12.5 % (ref 11.5–15.5)
WBC: 8.9 10*3/uL (ref 4.0–10.5)

## 2018-12-12 LAB — COMPREHENSIVE METABOLIC PANEL
ALBUMIN: 4.3 g/dL (ref 3.5–5.0)
ALK PHOS: 61 U/L (ref 38–126)
ALT: 12 U/L (ref 0–44)
ANION GAP: 12 (ref 5–15)
AST: 21 U/L (ref 15–41)
BILIRUBIN TOTAL: 1.3 mg/dL — AB (ref 0.3–1.2)
BUN: 13 mg/dL (ref 8–23)
CALCIUM: 9.4 mg/dL (ref 8.9–10.3)
CO2: 23 mmol/L (ref 22–32)
Chloride: 105 mmol/L (ref 98–111)
Creatinine, Ser: 1.06 mg/dL (ref 0.61–1.24)
GFR calc Af Amer: >60 mL/min (ref >60)
GFR calc non Af Amer: >60 mL/min (ref >60)
Glucose, Bld: 83 mg/dL (ref 70–99)
POTASSIUM: 3.7 mmol/L (ref 3.5–5.1)
Sodium: 140 mmol/L (ref 135–145)
TOTAL PROTEIN: 7.6 g/dL (ref 6.5–8.1)

## 2018-12-12 LAB — LIPASE, BLOOD: LIPASE: 30 U/L (ref 11–51)

## 2018-12-12 LAB — VALPROIC ACID LEVEL

## 2018-12-12 MED ORDER — CEPHALEXIN 500 MG PO CAPS: 500.0000 mg | ORAL_CAPSULE | Freq: Two times a day (BID) | ORAL | 0 refills | Status: DC

## 2018-12-12 MED ORDER — LORAZEPAM 2 MG/ML IJ SOLN
0.2500 mg | Freq: Once | INTRAMUSCULAR | Status: AC
  Administered 2018-12-12: 0.25 mg via INTRAVENOUS
  Filled 2018-12-12: qty 1

## 2018-12-12 MED ORDER — CEPHALEXIN 500 MG PO CAPS: 500.0000 mg | ORAL_CAPSULE | Freq: Two times a day (BID) | ORAL | 0 refills | Status: AC

## 2018-12-12 MED ORDER — SODIUM CHLORIDE 0.9 % IV BOLUS
500.0000 mL | Freq: Once | INTRAVENOUS | Status: AC
  Administered 2018-12-12: 500 mL via INTRAVENOUS

## 2018-12-12 MED ORDER — CEFTRIAXONE SODIUM 1 G IJ SOLR
1.0000 g | Freq: Once | INTRAMUSCULAR | Status: AC
  Administered 2018-12-12: 1 g via INTRAVENOUS
  Filled 2018-12-12: qty 10

## 2018-12-12 NOTE — ED Triage Notes (Signed)
Pt is presented from home, family reported that pt seems not to be feeling well, he is more sleepy than usual, typically able to ambulate by himself but has been in bed all day. They state his tracheostomy stoma "looks different."

## 2018-12-12 NOTE — Discharge Instructions (Signed)
You were given a prescription for antibiotics. Please take the antibiotic prescription fully.   A culture was sent of your urine today to determine if there is any bacterial growth. If the results of the culture are positive and you require an antibiotic or a change of your prescribed antibiotic you will be contacted by the hospital. If the results are negative you will not be contacted.  Please follow up with your primary care provider within 5-7 days for re-evaluation of your symptoms. Please return to the emergency department for any new or worsening symptoms.

## 2018-12-12 NOTE — ED Notes (Signed)
Bed: WA06 Expected date:  Expected time:  Means of arrival:  Comments: 

## 2018-12-12 NOTE — ED Provider Notes (Signed)
[facility] DEPT Provider Note   CSN: [id number] Arrival date & time: 12/12/18  1737     History   Chief Complaint Chief Complaint  Patient presents with  . Not Feeling Well   History obtained by patient, wife, and daughter.   HPI Bryan Arellano is a 67 y.o. male.  HPI  Pt is a 67 y/o male with a h/o MI, Afib, carotid artery occlusion, CHF, CKD, COPD, GERD, HTN, lung CA, PVD, squamous cell carcinoma of soft palate, dementia, s/p total laryngectomy who presents today for evaluation of not feeling well.   Level 5 caveat given that patient is normally nonverbal at baseline. Pt does not normally talk but can shake head yes/no when asking questions.  Most of history is obtained by family. They state that patient has been less active since yesterday. He did not get out of bed and normally he does. He has not been eating for the last 1-2 days.  Wife states that he has been touching his stoma site for the last several days.   Pt is normally incontinent. Wife reports dark urine recently and that he has had some blood in his urine. No known fevers at home.   Oncologist: Dr. Owens Shark  Past Medical History:  Diagnosis Date  . Acute MI ([place]) 09/25/2002   hx/notes 05/15/2011  . Atrial fibrillation ([place])    hx/notes 05/15/2011  . Carotid artery occlusion   . CHF (congestive heart failure) ([place]) 09/25/2002   hx/notes 05/15/2011  . Chronic renal failure    hx/notes 05/15/2011  . COPD (chronic obstructive pulmonary disease) ([place])    hx/notes 05/15/2011  . GERD (gastroesophageal reflux disease)    hx/notes 05/15/2011  . History of radiation therapy 1999  . Hypertension    hx/notes 05/15/2011  . Hypothyroidism    hx/notes 05/15/2011  . Internal carotid artery stenosis, right    Archie Endo 03/25/2017  . Iron deficiency anemia    hx/notes 05/15/2011  . Lung cancer ([place])    hx/notes 05/15/2011  . Pain around PEG tube site   . Prostate cancer ([place])    hx/notes 05/15/2011    . PVD (peripheral vascular disease) ([place])    hx/notes 05/15/2011  . 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 Laryngitis 03/26/2017  . Dysphagia 03/26/2017  . Odynophagia 03/26/2017  . Pharyngitis 03/25/2017  . Carotid stenosis, right 01/02/2017  . Moderate dementia with behavioral disturbance ([place]) 03/05/2015  . Cancer of soft palate ([place]) 03/05/2015  . Occlusion and stenosis of carotid artery without mention of cerebral infarction 02/25/2013  . 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 DYSPNEA 08/29/2008  . RADIATION THERAPY, HX OF 08/29/2008    Past Surgical History:  Procedure Laterality Date  . AORTO-FEMORAL BYPASS GRAFT  1981   hx/notes 05/15/2011  . CATARACT EXTRACTION     hx/notes 05/15/2011  . DIRECT LARYNGOSCOPY N/A 03/27/2017   Procedure: DIRECT LARYNGOSCOPY;  Surgeon: Melissa Montane, MD;  Location: [facility];  Service: ENT;  Laterality: N/A;  . ESOPHAGOSCOPY N/A 03/27/2017   Procedure: ESOPHAGOSCOPY;  Surgeon: Melissa Montane, MD;  Location: [facility] OR;  Service: ENT;  Laterality: N/A;  . GASTROSTOMY TUBE PLACEMENT  2013   TUBE LATER REMOVED in 2013  . PSEUDOANEURYSM REPAIR  2002   hx/notes 05/15/2011  . TRANSURETHRAL RESECTION OF BLADDER TUMOR WITH GYRUS (TURBT-GYRUS)  hx/notes 05/15/2011        Home Medications    Prior to Admission medications   Medication Sig Start Date End Date Taking? Authorizing Provider  acetaminophen (TYLENOL) 325 MG tablet Take 2 tablets (650 mg total) by mouth every 6 (six) hours as needed for mild pain (or Fever >/= 101). Patient not taking: Reported on 03/24/2018 03/28/17   Robbie Lis, MD  aspirin 81 MG tablet Take 81 mg by mouth daily.    [provider]  cephALEXin (KEFLEX) 500 MG capsule Take 1 capsule (500 mg total) by mouth 2 (two) times daily for 7 days. 12/12/18 12/19/18  Tiernan Millikin S, PA-C   divalproex (DEPAKOTE ER) 250 MG 24 hr tablet Take 1 tablet every night 03/24/18   Cameron Sprang, MD  donepezil (ARICEPT) 10 MG tablet TAKE 1 TABLET BY MOUTH ONCE DAILY(MIDDAY) 03/30/18   Cameron Sprang, MD  HYDROcodone-acetaminophen (NORCO) 5-325 MG tablet Take 1 tablet by mouth every 6 (six) hours as needed for moderate pain. 03/28/17   Robbie Lis, MD  levothyroxine (SYNTHROID, LEVOTHROID) 50 MCG tablet Take by mouth. 01/09/18   [provider]  simvastatin (ZOCOR) 10 MG tablet Take 1 tablet (10 mg total) by mouth every evening. 09/29/14   Gabriel Earing, PA-C    Family History Family History  Problem Relation Age of Onset  . Heart disease Sister        before age 55  . Clotting disorder Sister   . Cancer Brother   . Heart disease Brother   . Heart attack Brother     Social History Social History   Tobacco Use  . Smoking status: Former Smoker    Types: Cigarettes    Last attempt to quit: 04/18/1998    Years since quitting: 20.6  . Smokeless tobacco: Never Used  Substance Use Topics  . Alcohol use: No    Alcohol/week: 0.0 standard drinks    Comment: pt's wife states that he quit drinking in 1997, pt use to drink about 80 beers per week and 900 shots of liquor per week  . Drug use: No     Allergies   Patient has no known allergies.   Review of Systems Review of Systems  Unable to perform ROS: Patient nonverbal  Constitutional: Positive for appetite change. Negative for fever.  Respiratory: Negative for cough.   Gastrointestinal: Negative for diarrhea and vomiting.  Genitourinary: Positive for hematuria.       Dark urine    Physical Exam Updated Vital Signs BP (!) 155/88   Pulse 66   Temp (!) 96.6 F (35.9 C) (Rectal)   Resp 16   SpO2 99%   Physical Exam Vitals signs and nursing note reviewed.  Constitutional:      Appearance: He is well-developed.  HENT:     Head: Normocephalic and atraumatic.     Mouth/Throat:     Mouth: Mucous membranes  are moist.     Comments: Stoma appears clean, no surrounding erythema, swelling, or tenderness Eyes:     Extraocular Movements: Extraocular movements intact.     Conjunctiva/sclera: Conjunctivae normal.     Pupils: Pupils are equal, round, and reactive to light.  Neck:     Musculoskeletal: Neck supple.  Cardiovascular:     Rate and Rhythm: Normal rate and regular rhythm.     Heart sounds: No murmur.  Pulmonary:     Effort: Pulmonary effort is normal. No respiratory distress.     Breath  sounds: Normal breath sounds.  Abdominal:     Palpations: Abdomen is soft.     Tenderness: There is no right CVA tenderness or left CVA tenderness.     Comments: Reports TTP to the LUQ, no grimace on exam. No obvious rebound ttp or guarding  Musculoskeletal:     Comments: Trace ble edema  Skin:    General: Skin is warm and dry.  Neurological:     Mental Status: He is alert.  Psychiatric:        Mood and Affect: Mood normal.      ED Treatments / Results  Labs (all labs ordered are listed, but only abnormal results are displayed) Labs Reviewed  COMPREHENSIVE METABOLIC PANEL - Abnormal; Notable for the following components:      Result Value   Total Bilirubin 1.3 (*)    All other components within normal limits  URINALYSIS, ROUTINE W REFLEX MICROSCOPIC - Abnormal; Notable for the following components:   APPearance HAZY (*)    Hgb urine dipstick MODERATE (*)    Ketones, ur 20 (*)    Protein, ur 30 (*)    Nitrite POSITIVE (*)    Leukocytes, UA TRACE (*)    Bacteria, UA MANY (*)    All other components within normal limits  VALPROIC ACID LEVEL - Abnormal; Notable for the following components:   Valproic Acid Lvl <10 (*)    All other components within normal limits  URINE CULTURE  CBC WITH DIFFERENTIAL/PLATELET  LIPASE, BLOOD    EKG None  Radiology Dg Abd Acute W/chest  Result Date: 12/12/2018 CLINICAL DATA:  Not feeling well, sleeping EXAM: DG ABDOMEN ACUTE W/ 1V CHEST  COMPARISON:  09/18/2017 FINDINGS: Heart is borderline in size. No confluent airspace opacities, effusions or edema. Nonobstructive bowel gas pattern. No free air or organomegaly. Vascular calcifications in the pelvis. Thoracolumbar scoliosis and degenerative changes. IMPRESSION: No acute cardiopulmonary disease.  Borderline cardiomegaly. No evidence of bowel obstruction or free air. Electronically Signed   By: Rolm Baptise M.D.   On: 12/12/2018 19:22    Procedures Procedures (including critical care time)  Medications Ordered in ED Medications  sodium chloride 0.9 % bolus 500 mL (0 mLs Intravenous Stopped 12/12/18 2102)  cefTRIAXone (ROCEPHIN) 1 g in sodium chloride 0.9 % 100 mL IVPB (0 g Intravenous Stopped 12/12/18 2102)  LORazepam (ATIVAN) injection 0.25 mg (0.25 mg Intravenous Given 12/12/18 2100)     Initial Impression / Assessment and Plan / ED Course  I have reviewed the triage vital signs and the nursing notes.  Pertinent labs & imaging results that were available during my care of the patient were reviewed by me and considered in my medical decision making (see chart for details).   Final Clinical Impressions(s) / ED Diagnoses   Final diagnoses:  Acute cystitis with hematuria   Patient presenting for evaluation of very nonspecific symptoms, family members state that patient has not been eating much and does not seem like himself.  Has been less active recently.  Has had no URI symptoms.  No diarrhea or vomiting.  No known fevers that have been documented at home.  Patient's vitals are stable here without fevers.  On exam patient is nontoxic-appearing.  Lungs are clear.  His abdomen is soft.  He is does point to his left upper quadrant stating that he has pain but he has no grimacing on exam and has no rigidity or rebound tenderness.   X-ray of the chest and abdomen did not  show any evidence of pneumonia.  No obvious stool burden or free air on abdominal x-ray.  Labs are very  reassuring.  No leukocytosis or anemia.  CMP with normal electrolytes.  Bilirubin is slightly elevated.  Liver function are normal.  Lipase is negative.  Valproic acid level is subtherapeutic.  UA significant for urinary tract infection with leukocytes, nitrites, hematuria, 21-50 RBC, 11-20 WBC, and many bacteria.  He also has some ketones and protein in the urine.  Urine culture was sent. Patient given dose of ceftriaxone in the ED.  We will send home with course of Keflex and have him follow-up with his PCP.  Have advised family to monitor symptoms and return if worse.  They voiced understanding of the plan and reasons to return to the ED.  All questions answered.  Pt seen in conjunction with Dr. Tomi Bamberger who evaluated the pt and is in agreement with plan.   ED Discharge Orders         Ordered    cephALEXin (KEFLEX) 500 MG capsule  2 times daily     12/12/18 2118           Bishop [place] 12/12/18 2119    Dorie Rank, MD 12/12/18 2352

## 2018-12-14 ENCOUNTER — Other Ambulatory Visit: Payer: Self-pay

## 2018-12-14 LAB — URINE CULTURE: CULTURE: NO GROWTH

## 2018-12-14 NOTE — Patient Outreach (Signed)
[facility]) Care Management  12/14/2018  Hanley Woerner 05/26/1951 [id number]  Successful outreach to the patients wife, Icker Swigert, in efforts to confirm receipt of mailed resources. HIPAA identifiers confirmed. Mrs. Meissner is able to confirm receipt of mailed resources. Mrs. Ramakrishnan also states she has been in contact with [facility] and is in the process of enrolling the patient. It is reported a caseworker from [facility] has a home visit planned for tomorrow (12/17) at 11:30 am. Mrs. Trefz stated "as long as insurance approves he will start [facility]". BSW spoke with Mrs. Hally about this BSW remaining active until the patient is officially enrolled in case other resources are needed due to insurance. Mrs. Chahal is in agreement with this plan.  Prior to ending today's call, Mrs.Steege requests BSW assistance with housing resources. Mrs. Mccollam states "I would like to get away from this area". It is reported that senior apartments are desired as well as single family homes. BSW conducted a [email or web address] to locate housing options which would meet Mrs. Steven location and budgetary conditions. BSW to mail information to the patients home regarding search results.  Plan: BSW to outreach Mrs. Singleterry in the next two weeks to confirm receipt of mailed resources. BSW will also inquire on outcome of [facility] enrollment.  Daneen Schick, BSW, CDP [facility] [phone]

## 2018-12-28 ENCOUNTER — Other Ambulatory Visit: Payer: Self-pay

## 2018-12-28 NOTE — Patient Outreach (Signed)
[facility]) Care Management  12/28/2018  Romain Erion 1951-09-24 [id number]  Successful outreach to Terressa Koyanagi on today's date, HIPAA identities confirmed. Mrs. Mergenthaler is able to confirm receipt of mailed resources regarding housing within her budget. Mrs. Haas also confirms the patient will attend [facility] adult day program three days a week starting "next month". The patient will attend on Monday, Wednesday, and Friday. Mrs. Haubner reports a home visit is arranged for tomorrow with [facility] to discuss start date for the patient to begin attending.   BSW will perform a case closure as goals have been met. Mrs. Everingham is unable to identify any other community resource needs. Mrs. Taranto is in agreement with today's case closure and understands she may contact [facility] if future needs arise.  Daneen Schick, BSW, CDP [facility] [phone]

## 2019-03-04 ENCOUNTER — Inpatient Hospital Stay (HOSPITAL_COMMUNITY)
Admission: EM | Admit: 2019-03-04 | Discharge: 2019-03-08 | DRG: 726 | Disposition: A | Discharge status: Home / Self Care | Payer: Medicare (Managed Care) | Attending: Student in an Organized Health Care Education/Training Program | Admitting: Student in an Organized Health Care Education/Training Program

## 2019-03-04 ENCOUNTER — Other Ambulatory Visit: Payer: Self-pay

## 2019-03-04 DIAGNOSIS — Z79891 Long term (current) use of opiate analgesic: Secondary | ICD-10-CM

## 2019-03-04 DIAGNOSIS — C760 Malignant neoplasm of head, face and neck: Secondary | ICD-10-CM

## 2019-03-04 DIAGNOSIS — R310 Gross hematuria: Secondary | ICD-10-CM | POA: Diagnosis present

## 2019-03-04 DIAGNOSIS — E785 Hyperlipidemia, unspecified: Secondary | ICD-10-CM | POA: Diagnosis present

## 2019-03-04 DIAGNOSIS — E890 Postprocedural hypothyroidism: Secondary | ICD-10-CM

## 2019-03-04 DIAGNOSIS — Z818 Family history of other mental and behavioral disorders: Secondary | ICD-10-CM

## 2019-03-04 DIAGNOSIS — R319 Hematuria, unspecified: Secondary | ICD-10-CM

## 2019-03-04 DIAGNOSIS — Z7982 Long term (current) use of aspirin: Secondary | ICD-10-CM

## 2019-03-04 DIAGNOSIS — E876 Hypokalemia: Secondary | ICD-10-CM | POA: Diagnosis not present

## 2019-03-04 DIAGNOSIS — R339 Retention of urine, unspecified: Secondary | ICD-10-CM

## 2019-03-04 DIAGNOSIS — N320 Bladder-neck obstruction: Secondary | ICD-10-CM

## 2019-03-04 DIAGNOSIS — Z9002 Acquired absence of larynx: Secondary | ICD-10-CM

## 2019-03-04 DIAGNOSIS — G300 Alzheimer's disease with early onset: Secondary | ICD-10-CM

## 2019-03-04 DIAGNOSIS — R338 Other retention of urine: Secondary | ICD-10-CM | POA: Diagnosis present

## 2019-03-04 DIAGNOSIS — N401 Enlarged prostate with lower urinary tract symptoms: Secondary | ICD-10-CM | POA: Diagnosis not present

## 2019-03-04 DIAGNOSIS — G309 Alzheimer's disease, unspecified: Secondary | ICD-10-CM | POA: Diagnosis present

## 2019-03-04 DIAGNOSIS — E038 Other specified hypothyroidism: Secondary | ICD-10-CM

## 2019-03-04 DIAGNOSIS — Z8589 Personal history of malignant neoplasm of other organs and systems: Secondary | ICD-10-CM

## 2019-03-04 DIAGNOSIS — N179 Acute kidney failure, unspecified: Secondary | ICD-10-CM | POA: Diagnosis not present

## 2019-03-04 DIAGNOSIS — Z781 Physical restraint status: Secondary | ICD-10-CM

## 2019-03-04 DIAGNOSIS — Z85818 Personal history of malignant neoplasm of other sites of lip, oral cavity, and pharynx: Secondary | ICD-10-CM

## 2019-03-04 DIAGNOSIS — N1330 Unspecified hydronephrosis: Secondary | ICD-10-CM

## 2019-03-04 DIAGNOSIS — I6523 Occlusion and stenosis of bilateral carotid arteries: Secondary | ICD-10-CM

## 2019-03-04 DIAGNOSIS — Z8521 Personal history of malignant neoplasm of larynx: Secondary | ICD-10-CM

## 2019-03-04 DIAGNOSIS — N138 Other obstructive and reflux uropathy: Secondary | ICD-10-CM | POA: Diagnosis present

## 2019-03-04 DIAGNOSIS — Z79899 Other long term (current) drug therapy: Secondary | ICD-10-CM

## 2019-03-04 DIAGNOSIS — F0280 Dementia in other diseases classified elsewhere without behavioral disturbance: Secondary | ICD-10-CM

## 2019-03-04 DIAGNOSIS — Z930 Tracheostomy status: Secondary | ICD-10-CM

## 2019-03-04 DIAGNOSIS — Z95828 Presence of other vascular implants and grafts: Secondary | ICD-10-CM

## 2019-03-04 DIAGNOSIS — Z87440 Personal history of urinary (tract) infections: Secondary | ICD-10-CM

## 2019-03-04 DIAGNOSIS — Z832 Family history of diseases of the blood and blood-forming organs and certain disorders involving the immune mechanism: Secondary | ICD-10-CM

## 2019-03-04 DIAGNOSIS — Q6101 Congenital single renal cyst: Secondary | ICD-10-CM

## 2019-03-04 DIAGNOSIS — Z960 Presence of urogenital implants: Secondary | ICD-10-CM

## 2019-03-04 DIAGNOSIS — Z87891 Personal history of nicotine dependence: Secondary | ICD-10-CM

## 2019-03-04 DIAGNOSIS — Z9221 Personal history of antineoplastic chemotherapy: Secondary | ICD-10-CM

## 2019-03-04 DIAGNOSIS — F0281 Dementia in other diseases classified elsewhere with behavioral disturbance: Secondary | ICD-10-CM

## 2019-03-04 DIAGNOSIS — Z923 Personal history of irradiation: Secondary | ICD-10-CM

## 2019-03-04 DIAGNOSIS — Z8249 Family history of ischemic heart disease and other diseases of the circulatory system: Secondary | ICD-10-CM

## 2019-03-04 DIAGNOSIS — Z79890 Hormone replacement therapy (postmenopausal): Secondary | ICD-10-CM

## 2019-03-04 LAB — CBC WITH DIFFERENTIAL/PLATELET
Abs Immature Granulocytes: 0.11 10*3/uL — ABNORMAL HIGH (ref 0.00–0.07)
Basophils Absolute: 0 10*3/uL (ref 0.0–0.1)
Basophils Relative: 0 %
Eosinophils Absolute: 0 10*3/uL (ref 0.0–0.5)
Eosinophils Relative: 0 %
HEMATOCRIT: 47.3 % (ref 39.0–52.0)
HEMOGLOBIN: 16 g/dL (ref 13.0–17.0)
IMMATURE GRANULOCYTES: 1 %
LYMPHS ABS: 0.5 10*3/uL — AB (ref 0.7–4.0)
LYMPHS PCT: 5 %
MCH: 31.3 pg (ref 26.0–34.0)
MCHC: 33.8 g/dL (ref 30.0–36.0)
MCV: 92.6 fL (ref 80.0–100.0)
Monocytes Absolute: 0.7 10*3/uL (ref 0.1–1.0)
Monocytes Relative: 6 %
NEUTROS PCT: 88 %
NRBC: 0 % (ref 0.0–0.2)
Neutro Abs: 9.6 10*3/uL — ABNORMAL HIGH (ref 1.7–7.7)
Platelets: UNDETERMINED 10*3/uL (ref 150–400)
RBC: 5.11 MIL/uL (ref 4.22–5.81)
RDW: 12.4 % (ref 11.5–15.5)
WBC: 10.4 10*3/uL (ref 4.0–10.5)

## 2019-03-04 LAB — URINALYSIS, ROUTINE W REFLEX MICROSCOPIC
Bilirubin Urine: NEGATIVE
Glucose, UA: NEGATIVE mg/dL
Ketones, ur: NEGATIVE mg/dL
Leukocytes,Ua: NEGATIVE
Nitrite: NEGATIVE
Protein, ur: 100 mg/dL — AB
Specific Gravity, Urine: 1.02 (ref 1.005–1.030)
pH: 6.5 (ref 5.0–8.0)

## 2019-03-04 LAB — BASIC METABOLIC PANEL
Anion gap: 16 — ABNORMAL HIGH (ref 5–15)
BUN: 11 mg/dL (ref 8–23)
CO2: 20 mmol/L — AB (ref 22–32)
Calcium: 10.1 mg/dL (ref 8.9–10.3)
Chloride: 105 mmol/L (ref 98–111)
Creatinine, Ser: 1.3 mg/dL — ABNORMAL HIGH (ref 0.61–1.24)
GFR calc Af Amer: >60 mL/min (ref >60)
GFR calc non Af Amer: 56 mL/min — ABNORMAL LOW (ref >60)
Glucose, Bld: 116 mg/dL — ABNORMAL HIGH (ref 70–99)
Potassium: 4.5 mmol/L (ref 3.5–5.1)
Sodium: 141 mmol/L (ref 135–145)

## 2019-03-04 LAB — URINALYSIS, MICROSCOPIC (REFLEX)

## 2019-03-04 LAB — LACTIC ACID, PLASMA: Lactic Acid, Venous: 1.9 mmol/L (ref 0.5–1.9)

## 2019-03-04 MED ORDER — LEVOTHYROXINE SODIUM 50 MCG PO TABS
50.0000 ug | ORAL_TABLET | Freq: Every day | ORAL | Status: DC
  Administered 2019-03-05 – 2019-03-08 (×4): 50 ug via ORAL
  Filled 2019-03-04 (×4): qty 1

## 2019-03-04 MED ORDER — DONEPEZIL HCL 10 MG PO TABS
10.0000 mg | ORAL_TABLET | Freq: Every day | ORAL | Status: DC
  Administered 2019-03-05 – 2019-03-08 (×4): 10 mg via ORAL
  Filled 2019-03-04 (×4): qty 1

## 2019-03-04 MED ORDER — LORAZEPAM 2 MG/ML IJ SOLN
1.0000 mg | Freq: Once | INTRAMUSCULAR | Status: AC
  Administered 2019-03-04: 1 mg via INTRAVENOUS
  Filled 2019-03-04: qty 1

## 2019-03-04 MED ORDER — ENOXAPARIN SODIUM 40 MG/0.4ML ~~LOC~~ SOLN
40.0000 mg | SUBCUTANEOUS | Status: DC
  Administered 2019-03-05 – 2019-03-08 (×4): 40 mg via SUBCUTANEOUS
  Filled 2019-03-04 (×4): qty 0.4

## 2019-03-04 MED ORDER — SODIUM CHLORIDE 0.9 % IV SOLN
INTRAVENOUS | Status: AC
  Administered 2019-03-04: 19:00:00 via INTRAVENOUS

## 2019-03-04 MED ORDER — HYDROMORPHONE HCL 1 MG/ML IJ SOLN: 0.5000 mg | INTRAMUSCULAR | Status: DC | PRN

## 2019-03-04 MED ORDER — HYDROMORPHONE HCL 1 MG/ML IJ SOLN: 0.4000 mg | INTRAMUSCULAR | Status: DC | PRN

## 2019-03-04 MED ORDER — SODIUM CHLORIDE 0.9 % IV BOLUS
500.0000 mL | Freq: Once | INTRAVENOUS | Status: AC
  Administered 2019-03-04: 500 mL via INTRAVENOUS

## 2019-03-04 MED ORDER — SIMVASTATIN 20 MG PO TABS
10.0000 mg | ORAL_TABLET | Freq: Every evening | ORAL | Status: DC
  Administered 2019-03-05 – 2019-03-07 (×3): 10 mg via ORAL
  Filled 2019-03-04 (×3): qty 1

## 2019-03-04 MED ORDER — HYDROMORPHONE HCL 1 MG/ML IJ SOLN: 0.2500 mg | INTRAMUSCULAR | Status: DC | PRN

## 2019-03-04 MED ORDER — QUETIAPINE FUMARATE 50 MG PO TABS: 50.0000 mg | ORAL_TABLET | Freq: Every day | ORAL | Status: DC

## 2019-03-04 MED ORDER — LORAZEPAM 2 MG/ML IJ SOLN: 1.0000 mg | Freq: Four times a day (QID) | INTRAMUSCULAR | Status: DC | PRN

## 2019-03-04 MED ORDER — ASPIRIN EC 81 MG PO TBEC
81.0000 mg | DELAYED_RELEASE_TABLET | Freq: Every day | ORAL | Status: DC
  Administered 2019-03-05: 81 mg via ORAL
  Filled 2019-03-04: qty 1

## 2019-03-04 MED ORDER — QUETIAPINE FUMARATE 50 MG PO TABS
50.0000 mg | ORAL_TABLET | Freq: Two times a day (BID) | ORAL | Status: DC | PRN
  Filled 2019-03-04: qty 1

## 2019-03-04 NOTE — H&P (Signed)
Date: 03/04/2019               Patient Name:  Bryan Arellano MRN: [id number]  DOB: [birth year] Age / Sex: 68 y.o., male   PCP: Bryan Carol, MD         Medical Service: Internal Medicine Teaching Service         Attending Physician: Dr. [name], MD    First Contact: Dr. Annie Paras Pager: [phone]  Second Contact: Dr. 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 The wife is at bedside and provides the history at the patient is sleeping soundly and is nonverbal. She states that at baseline, he is able to walk, use the bathroom, and eat independently. She helps him dress, prepares his food, and manages his medications. He is not aggressive or agitated at home. She describes him as mellow. Although he is nonverbal, he can communicate with his wife well through head movements and body language. He has had dementia since 2017 and has progressively worsened.   His wife states that yesterday she noticed he seemed fidgety and kept cradling his lower abdomen. Yesterday she noted that he had a golf ball shaped lump in his lower stomach. She reports that he had multiple episodes of diarrhea yesterday. She also noticed drops of blood on the toilet seat, but no blood in his stool. This morning, she stated he still appeared uncomfortable and the abdominal mass had grown to the size of a baseball. She states he otherwise has appeared in his normal state of health. No fevers, cough, or grabbing anywhere else on his body in pain.    Bladder scan showed 700cc retained urine and a foley catheter was placed. His wife reports that he has never had difficulties urinating in the past or prostate disease. Of note, patient was seen in the ED 11/2018 for a UTI. UA at that time demonstrated moderate blood, positive nitrites, and trace leukocytes. Urine culture was negative. He was started on Keflex at that time. His wife says that he had hematuria again in February and was started on Amoxicillin by his PCP for UTI. He has received about 6 days of amoxicillin here and there, but not contiguously. She hadn't noticed the hematuria again until yesterday.   Upon arrival to the ED, patient was afebrile, tachycardic to 123, and hypertensive to 160/133. Labs significant for normal white count, Cr elevated to 1.3 (BL 1), lactate 1.9. UA with large hemoglobin (in the setting of foley placement), 100 protein, neg nitrites, neg leukocytes, few bacteria. Urine culture pending. The patient became agitated and aggressive when ED staff tried to place a foley catheter. He received sedation with ativan and the foley was inserted. Urology was consulted via telephone and recommends a voiding trial before formal consult.   Meds:  Current Meds  Medication Sig  . aspirin 81 MG tablet Take 81 mg by mouth daily.  Marland Kitchen donepezil (ARICEPT) 10 MG tablet TAKE 1 TABLET BY MOUTH ONCE DAILY(MIDDAY) (Patient taking differently: Take 10 mg by  mouth daily. )  . levothyroxine (SYNTHROID, LEVOTHROID) 50 MCG tablet Take 50 mcg by mouth daily.   Marland Kitchen ROCKLATAN 0.02-0.005 % SOLN Place 1 drop into both eyes at bedtime.  . simvastatin (ZOCOR) 10 MG tablet Take 1 tablet (10 mg total) by mouth every evening.  . Vitamin D, Ergocalciferol, (DRISDOL) 1.25 MG (50000 UT) CAPS capsule Take 50,000 Units by mouth every 30 (thirty) days.   Allergies: Allergies as of 03/04/2019  . (No Known Allergies)   Past Medical History:  Diagnosis Date  . Acute MI ([place]) 09/25/2002   hx/notes  05/15/2011  . Atrial fibrillation ([place])    hx/notes 05/15/2011  . Carotid artery occlusion   . CHF (congestive heart failure) ([place]) 09/25/2002   hx/notes 05/15/2011  . Chronic renal failure    hx/notes 05/15/2011  . COPD (chronic obstructive pulmonary disease) ([place])    hx/notes 05/15/2011  . GERD (gastroesophageal reflux disease)    hx/notes 05/15/2011  . History of radiation therapy 1999  . Hypertension    hx/notes 05/15/2011  . Hypothyroidism    hx/notes 05/15/2011  . Internal carotid artery stenosis, right    Archie Endo 03/25/2017  . Iron deficiency anemia    hx/notes 05/15/2011  . Lung cancer ([place])    hx/notes 05/15/2011  . Pain around PEG tube site   . Prostate cancer ([place])    hx/notes 05/15/2011  . PVD (peripheral vascular disease) ([place])    hx/notes 05/15/2011  . 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 AORTO-FEMORAL BYPASS GRAFT  1981   hx/notes 05/15/2011  . CATARACT EXTRACTION     hx/notes 05/15/2011  . DIRECT LARYNGOSCOPY N/A 03/27/2017   Procedure: DIRECT LARYNGOSCOPY;  Surgeon: Melissa Montane, MD;  Location: [facility];  Service: ENT;  Laterality: N/A;  . ESOPHAGOSCOPY N/A 03/27/2017   Procedure: ESOPHAGOSCOPY;  Surgeon: Melissa Montane, MD;  Location[facility] OR;  Service: ENT;  Laterality: N/A;  . GASTROSTOMY TUBE PLACEMENT  2013   TUBE LATER REMOVED in 2013  . PSEUDOANEURYSM REPAIR  2002   hx/notes 05/15/2011  . TRANSURETHRAL RESECTION OF BLADDER TUMOR WITH GYRUS (TURBT-GYRUS)     hx/notes 05/15/2011    Family History: Family History  Problem Relation Age of Onset  . Heart disease Sister        before age 69  . Clotting disorder Sister   . Cancer Brother   . Heart disease Brother   . Heart attack Brother   Dementia in his father and sister  Social History: Lives at home with his wife. They have grandchildren who stay with them during the week as well. Former heavy smoker.  Quit smoking in 1999. Quit drinking in 1997. Wife denies illicit drug use. His daughter is his healthcare power of attorney.   Review of Systems: A complete ROS was unable to be completed due to patient's nonverbal status and sedation.  Physical Exam: Blood pressure 121/81, pulse 97, temperature 98.3 F (36.8 C), temperature source Oral, resp. rate 14, SpO2 99 %.  Constitutional: Frail, thin man. Sleeping soundly. No distress. HEENT: Laryngectomy stoma without discharge or erythema.  Cardiovascular: Normal rate and regular rhythm. No murmurs, rubs, or gallops. Pulmonary/Chest: Effort normal. Clear to auscultation bilaterally in the anterior fields. No wheezes, rales, or rhonchi. Abdominal: Bowel sounds present. Soft, non-distended, non-tender. GU: External genitalia normal with foley catheter in place. Ext: No lower extremity edema. Skin: Warm and  dry.   Assessment & Plan by Problem: Active Problems:   Urinary retention  Mr. 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 He was found to be retaining urine in the ED and a foley catheter was placed.   Urinary Retention AKI - Cr elevated to 1.3, BL 1. Likely postrenal in the setting of obstruction - Hematuria on UA is most likely from traumatic foley insertion. He was treated for UTI in December and February with antibiotics, but prior urine culture without growth. Infection unlikely at this time given negative leukocytes and nitrites on UA.  - Retention may be caused by BPH. Although wife denies history of prostate issues, CT abdomen pelvis in 2013 showed prostatic enlargement  - He is on no medications that would cause urinary retention  - ED provider spoke with urology. They recommend removing  the foley after 12hrs and giving a 6hr voiding trial. If he is able to void independently, they recommend discharge home. If he fails the trial, they ask to be formally consulted.  Plan - Remove foley at 3am and give voiding trial until 9am - F/u urine culture  Agitation - Patient is "mellow" at baseline. He required sedation with ativan and physical restraints in order to place the foley in the ED. There is concern that without restraints he will pull out his foley and cause traumatic injury to his urethra. Plan - Maintain soft restraints - Seroquel 50mg  BID prn for sleep and agitation - Dilaudid 0.4mg  q4hrs prn for pain  - Ativan 1mg  q6hrs prn for agitation; page MD before administering due to patient's age and baseline dementia.  Dementia: continue home donepezil 10mg  daily Hypothyroidism: continue home synthroid 83mcg daily HLD: continue home simvastatin 10mg  daily  FEN: NS 44ml/hr, NPO while sedated, replace electrolytes as needed  DVT ppx: Lovenox Code status: FULL code  Dispo: Admit patient to Observation with expected length of stay less than 2 midnights.  Signed: Corinne Ports, MD 03/04/2019, 6:08 PM  Pager: [phone]

## 2019-03-04 NOTE — ED Notes (Addendum)
ED TO INPATIENT HANDOFF REPORT  ED Nurse Name and Phone #:  Leodis Rains [phone]  S Name/Age/Gender Bryan Arellano 68 y.o. male Room/Bed: 040C/040C  Code Status   Code Status: Full Code  Home/SNF/Other Home {Patient oriented to:- has trach, unable to speak. Mouths words Is this baseline? Yes   Triage Complete: Triage complete  Chief Complaint URINARY RETENTION  Triage Note [facility] retention x 1 month per staff, hasn't been taking his meds as he should. EMS reports distended abdomen and pain on palpation, staff reports pt is urinating a small amount with blood in it.     Allergies No Known Allergies  Level of Care/Admitting Diagnosis ED Disposition    ED Disposition Condition Bryan Arellano Area: [place] [[id number]]  Level of Care: Med-Surg [16]  Diagnosis: Urinary retention [725366]  Admitting Physician: [name] [[id number]]  Attending Physician: [name] [[id number]]  PT Class (Do Not Modify): Observation [104]  PT Acc Code (Do Not Modify): Observation [10022]       B Medical/Surgery History Past Medical History:  Diagnosis Date  . Acute MI ([place]) 09/25/2002   hx/notes 05/15/2011  . Atrial fibrillation ([place])    hx/notes 05/15/2011  . Carotid artery occlusion   . CHF (congestive heart failure) ([place]) 09/25/2002   hx/notes 05/15/2011  . Chronic renal failure    hx/notes 05/15/2011  . COPD (chronic obstructive pulmonary disease) ([place])    hx/notes 05/15/2011  . GERD (gastroesophageal reflux disease)    hx/notes 05/15/2011  . History of radiation therapy 1999  . Hypertension    hx/notes 05/15/2011  . Hypothyroidism    hx/notes 05/15/2011  . Internal carotid artery stenosis, right    Archie Endo 03/25/2017  . Iron deficiency anemia    hx/notes 05/15/2011  . Lung cancer ([place])    hx/notes 05/15/2011  . Pain around PEG tube site   . Prostate cancer ([place])    hx/notes 05/15/2011  . PVD (peripheral  vascular disease) ([place])    hx/notes 05/15/2011  . 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 AORTO-FEMORAL BYPASS GRAFT  1981   hx/notes 05/15/2011  . CATARACT EXTRACTION     hx/notes 05/15/2011  . DIRECT LARYNGOSCOPY N/A 03/27/2017   Procedure: DIRECT LARYNGOSCOPY;  Surgeon: Melissa Montane, MD;  Location: [facility];  Service: ENT;  Laterality: N/A;  . ESOPHAGOSCOPY N/A 03/27/2017   Procedure: ESOPHAGOSCOPY;  Surgeon: Melissa Montane, MD;  Location: [facility] OR;  Service: ENT;  Laterality: N/A;  . GASTROSTOMY TUBE PLACEMENT  2013   TUBE LATER REMOVED in 2013  . PSEUDOANEURYSM REPAIR  2002   hx/notes 05/15/2011  . TRANSURETHRAL RESECTION OF BLADDER TUMOR WITH GYRUS (TURBT-GYRUS)     hx/notes 05/15/2011     A IV Location/Drains/Wounds Patient Lines/Drains/Airways Status   Active Line/Drains/Airways    Name:   Placement date:   Placement time:   Site:   Days:   Peripheral IV 03/04/19 Anterior;Distal;Right;Upper Arm   03/04/19    1337    Arm   less than 1   Gastrostomy/Enterostomy Gastrostomy LUQ   -    -    LUQ      Urethral Catheter Jontue Crumpacker Straight-tip;Temperature probe 16 Fr.   03/04/19    1420    Straight-tip;Temperature probe   less than 1   Incision (Closed)  03/27/17 N/A Other (Comment)   03/27/17    1148     707          Intake/Output Last 24 hours  Intake/Output Summary (Last 24 hours) at 03/04/2019 1728 Last data filed at 03/04/2019 1728 Gross per 24 hour  Intake 500 ml  Output -  Net 500 ml    Labs/Imaging Results for orders placed or performed during the Arellano encounter of 03/04/19 (from the past 48 hour(s))  CBC with Differential     Status: Abnormal   Collection Time: 03/04/19  1:42 PM  Result Value Ref Range   WBC 10.4 4.0 - 10.5 K/uL   RBC 5.11 4.22 - 5.81 MIL/uL   Hemoglobin 16.0 13.0 - 17.0 g/dL   HCT 47.3 39.0 - 52.0 %   MCV 92.6 80.0 - 100.0 fL   MCH 31.3 26.0  - 34.0 pg   MCHC 33.8 30.0 - 36.0 g/dL   RDW 12.4 11.5 - 15.5 %   Platelets PLATELET CLUMPS NOTED ON SMEAR, UNABLE TO ESTIMATE 150 - 400 K/uL   nRBC 0.0 0.0 - 0.2 %   Neutrophils Relative % 88 %   Neutro Abs 9.6 (H) 1.7 - 7.7 K/uL   Lymphocytes Relative 5 %   Lymphs Abs 0.5 (L) 0.7 - 4.0 K/uL   Monocytes Relative 6 %   Monocytes Absolute 0.7 0.1 - 1.0 K/uL   Eosinophils Relative 0 %   Eosinophils Absolute 0.0 0.0 - 0.5 K/uL   Basophils Relative 0 %   Basophils Absolute 0.0 0.0 - 0.1 K/uL   Immature Granulocytes 1 %   Abs Immature Granulocytes 0.11 (H) 0.00 - 0.07 K/uL    Comment: Performed at [facility], [address] [place], [place] [address]  Basic metabolic panel     Status: Abnormal   Collection Time: 03/04/19  1:42 PM  Result Value Ref Range   Sodium 141 135 - 145 mmol/L   Potassium 4.5 3.5 - 5.1 mmol/L    Comment: SLIGHT HEMOLYSIS   Chloride 105 98 - 111 mmol/L   CO2 20 (L) 22 - 32 mmol/L   Glucose, Bld 116 (H) 70 - 99 mg/dL   BUN 11 8 - 23 mg/dL   Creatinine, Ser 1.30 (H) 0.61 - 1.24 mg/dL   Calcium 10.1 8.9 - 10.3 mg/dL   GFR calc non Af Amer 56 (L) >60 mL/min   GFR calc Af Amer >60 >60 mL/min   Anion gap 16 (H) 5 - 15    Comment: Performed at [facility], [place] [address] [place], [place] [address]  Urinalysis, Routine w reflex microscopic     Status: Abnormal   Collection Time: 03/04/19  1:42 PM  Result Value Ref Range   Color, Urine YELLOW YELLOW   APPearance CLOUDY (A) CLEAR   Specific Gravity, Urine 1.020 1.005 - 1.030   pH 6.5 5.0 - 8.0   Glucose, UA NEGATIVE NEGATIVE mg/dL   Hgb urine dipstick LARGE (A) NEGATIVE   Bilirubin Urine NEGATIVE NEGATIVE   Ketones, ur NEGATIVE NEGATIVE mg/dL   Protein, ur 100 (A) NEGATIVE mg/dL   Nitrite NEGATIVE NEGATIVE   Leukocytes,Ua NEGATIVE NEGATIVE    Comment: Performed at [facility] [address] [place], [place] [address]  Urinalysis, Microscopic (reflex)     Status: Abnormal   Collection  Time: 03/04/19  1:42 PM  Result Value Ref Range   RBC / HPF 21-50 0 - 5 RBC/hpf   WBC,  UA 0-5 0 - 5 WBC/hpf   Bacteria, UA FEW (A) NONE SEEN   Squamous Epithelial / LPF 0-5 0 - 5    Comment: Performed at [facility], [place] [address], [place], [place] [address]  Lactic acid, plasma     Status: None   Collection Time: 03/04/19  3:12 PM  Result Value Ref Range   Lactic Acid, Venous 1.9 0.5 - 1.9 mmol/L    Comment: Performed at [facility] [address], [place], [place] [address]   No results found.  Pending Labs Unresulted Labs (From admission, onward)    Start     Ordered   03/04/19 1312  Lactic acid, plasma  Now then every 2 hours,   STAT     03/04/19 1311   03/04/19 1300  Urine culture  ONCE - STAT,   STAT     03/04/19 1259          Vitals/Pain Today's Vitals   03/04/19 1515 03/04/19 1600 03/04/19 1645 03/04/19 1700  BP: 119/84 (!) 139/91 137/88 123/83  Pulse: (!) 106 (!) 102 96 100  Resp:      Temp:      TempSrc:      SpO2: 97% 100% 97% 98%    Isolation Precautions No active isolations  Medications Medications  enoxaparin (LOVENOX) injection 40 mg (has no administration in time range)  QUEtiapine (SEROQUEL) tablet 50 mg (has no administration in time range)  LORazepam (ATIVAN) injection 1 mg (has no administration in time range)  levothyroxine (SYNTHROID, LEVOTHROID) tablet 50 mcg (has no administration in time range)  donepezil (ARICEPT) tablet 10 mg (has no administration in time range)  simvastatin (ZOCOR) tablet 10 mg (has no administration in time range)  aspirin EC tablet 81 mg (has no administration in time range)  0.9 %  sodium chloride infusion (has no administration in time range)  HYDROmorphone (DILAUDID) injection 0.4 mg (has no administration in time range)  LORazepam (ATIVAN) injection 1 mg (1 mg Intravenous Given 03/04/19 1415)  sodium chloride 0.9 % bolus 500 mL (0 mLs Intravenous Stopped 03/04/19 1728)    Mobility walks with  person assist     Focused Assessments Urinary catheter   R Recommendations: See Admitting Provider Note  Report given to:   Additional Notes:  Placed catheter for urinary retention- 1200 mL out so far.  Pt to be in restraints until catheter removed tomorrow per admitting MD

## 2019-03-04 NOTE — ED Triage Notes (Signed)
[facility] for urinary retention x 1 month per staff, hasn't been taking his meds as he should. EMS reports distended abdomen and pain on palpation, staff reports pt is urinating a small amount with blood in it.

## 2019-03-04 NOTE — ED Provider Notes (Addendum)
[facility] EMERGENCY DEPARTMENT Provider Note   CSN: [id number] Arrival date & time: 03/04/19  1213    History   Chief Complaint Chief Complaint  Patient presents with  . Urinary Retention    HPI Bryan Arellano is a 68 y.o. 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  Level 5 caveat due to baseline dementia/nonverbal.  History is obtained from wife who is his primary caregiver.  Wife noticed last night that patient's lower abdomen was distended and rigid.  This worsened this morning.  He has been more fidgety than baseline.  Patient wears diapers and wife reports some hematuria that has been there since last month.  Difficult to determine if there is any changes to urine output.  Reportedly, [facility] recently given antibiotics for UTI but wife had a difficult time administering this to patient and did not finish it.  Wife reports associated chills but no fevers.  Other than being more fidgety and trying to remove EKG leads/pulse o in the ER, wife denies new changes to baseline such as increased combativeness, somnolence.  Denies fever, vomiting, diarrhea.  No previous issues with urinary retention.  No alleviating or aggravating factors.    HPI  Past Medical History:  Diagnosis Date  . Acute MI ([place]) 09/25/2002   hx/notes 05/15/2011  . Atrial fibrillation ([place])    hx/notes 05/15/2011  . Carotid artery occlusion   . CHF (congestive heart failure) ([place]) 09/25/2002   hx/notes 05/15/2011  . Chronic renal failure    hx/notes 05/15/2011  . COPD (chronic obstructive pulmonary disease) ([place])    hx/notes 05/15/2011  . GERD (gastroesophageal reflux disease)    hx/notes 05/15/2011  . History of radiation therapy 1999  . Hypertension    hx/notes 05/15/2011  . Hypothyroidism    hx/notes 05/15/2011  . Internal carotid artery stenosis,  right    Archie Endo 03/25/2017  . Iron deficiency anemia    hx/notes 05/15/2011  . Lung cancer ([place])    hx/notes 05/15/2011  . Pain around PEG tube site   . Prostate cancer ([place])    hx/notes 05/15/2011  . PVD (peripheral vascular disease) ([place])    hx/notes 05/15/2011  . 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 Urinary retention 03/04/2019  . Laryngitis 03/26/2017  . Dysphagia 03/26/2017  . Odynophagia 03/26/2017  . Pharyngitis 03/25/2017  . Carotid stenosis, right 01/02/2017  . 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 Cancer of soft palate ([place]) 03/05/2015  . Occlusion and stenosis of carotid artery without mention of cerebral infarction 02/25/2013  . 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 DYSPNEA 08/29/2008  . RADIATION THERAPY, HX OF 08/29/2008    Past Surgical History:  Procedure Laterality Date  . AORTO-FEMORAL BYPASS GRAFT  1981   hx/notes 05/15/2011  . CATARACT EXTRACTION     hx/notes 05/15/2011  . DIRECT LARYNGOSCOPY N/A 03/27/2017   Procedure: DIRECT LARYNGOSCOPY;  Surgeon: Melissa Montane, MD;  Location: [facility];  Service: ENT;  Laterality: N/A;  . ESOPHAGOSCOPY N/A 03/27/2017   Procedure: ESOPHAGOSCOPY;  Surgeon: Melissa Montane, MD;  Location: [facility];  Service: ENT;  Laterality: N/A;  . GASTROSTOMY TUBE PLACEMENT  2013   TUBE LATER REMOVED  in 2013  . PSEUDOANEURYSM REPAIR  2002   hx/notes 05/15/2011  . TRANSURETHRAL RESECTION OF BLADDER TUMOR WITH GYRUS (TURBT-GYRUS)     hx/notes 05/15/2011        Home Medications    Prior to Admission medications   Medication Sig Start Date End Date Taking? Authorizing Provider  aspirin 81 MG tablet Take 81 mg by mouth daily.   Yes [provider]  donepezil (ARICEPT) 10 MG tablet TAKE 1 TABLET BY MOUTH ONCE DAILY(MIDDAY) Patient taking differently: Take 10 mg by mouth daily.   03/30/18  Yes Cameron Sprang, MD  levothyroxine (SYNTHROID, LEVOTHROID) 50 MCG tablet Take 50 mcg by mouth daily.  01/09/18  Yes [provider]  ROCKLATAN 0.02-0.005 % SOLN Place 1 drop into both eyes at bedtime. 12/01/18  Yes [provider]  simvastatin (ZOCOR) 10 MG tablet Take 1 tablet (10 mg total) by mouth every evening. 09/29/14  Yes Rhyne, Samantha J, PA-C  Vitamin D, Ergocalciferol, (DRISDOL) 1.25 MG (50000 UT) CAPS capsule Take 50,000 Units by mouth every 30 (thirty) days.   Yes [provider]  acetaminophen (TYLENOL) 325 MG tablet Take 2 tablets (650 mg total) by mouth every 6 (six) hours as needed for mild pain (or Fever >/= 101). Patient not taking: Reported on 03/24/2018 03/28/17   Robbie Lis, MD  divalproex (DEPAKOTE ER) 250 MG 24 hr tablet Take 1 tablet every night Patient not taking: Reported on 03/04/2019 03/24/18   Cameron Sprang, MD  HYDROcodone-acetaminophen (NORCO) 5-325 MG tablet Take 1 tablet by mouth every 6 (six) hours as needed for moderate pain. Patient not taking: Reported on 03/04/2019 03/28/17   Robbie Lis, MD    Family History Family History  Problem Relation Age of Onset  . Heart disease Sister        before age 6  . Clotting disorder Sister   . Cancer Brother   . Heart disease Brother   . Heart attack Brother     Social History Social History   Tobacco Use  . Smoking status: Former Smoker    Types: Cigarettes    Last attempt to quit: 04/18/1998    Years since quitting: 20.8  . Smokeless tobacco: Never Used  Substance Use Topics  . Alcohol use: No    Alcohol/week: 0.0 standard drinks    Comment: pt's wife states that he quit drinking in 1997, pt use to drink about 80 beers per week and 900 shots of liquor per week  . Drug use: No     Allergies   Patient has no known allergies.   Review of Systems Review of Systems  Unable to perform ROS: Dementia  Constitutional: Positive for chills.  Gastrointestinal:  Positive for abdominal pain and nausea.  Genitourinary: Positive for difficulty urinating and hematuria.  All other systems reviewed and are negative.    Physical Exam Updated Vital Signs BP 123/83   Pulse 100   Temp 98.3 F (36.8 C) (Oral)   Resp 14   SpO2 98%   Physical Exam Vitals signs and nursing note reviewed.  Constitutional:      Appearance: He is well-developed.     Comments: Non toxic.  Keeps eyes closed during exam.  Does not follow basic commands.  Wife at bedside.  He is trying to remove pulse ox, EKG leads.  HENT:     Head: Normocephalic and atraumatic.     Nose: Nose normal.  Eyes:     Conjunctiva/sclera: Conjunctivae  normal.     Pupils: Pupils are equal, round, and reactive to light.  Neck:     Musculoskeletal: Normal range of motion.  Cardiovascular:     Rate and Rhythm: Normal rate and regular rhythm.     Heart sounds: Normal heart sounds.  Pulmonary:     Effort: Pulmonary effort is normal.     Breath sounds: Normal breath sounds.  Abdominal:     General: Bowel sounds are normal.     Palpations: Abdomen is soft.     Tenderness: There is abdominal tenderness in the suprapubic area.     Comments: Mild/moderate distention to suprapubic area, rigidity.  Otherwise abdomen is soft.  No palpation to right upper quadrant, right lower quadrant.  No CVA tenderness.  Genitourinary:    Comments: No groin lymphadenopathy.  Uncircumcised male.  No testicular tenderness or edema.  Some dribbling of urine. Prominent, rubbery but smooth prostate. No obvious discomfort with rectal exam.  Good rectal tone.  Musculoskeletal: Normal range of motion.  Skin:    General: Skin is warm and dry.     Capillary Refill: Capillary refill takes less than 2 seconds.     Comments: No sacral/buttocks skin breakdown.  Neurological:     Mental Status: He is alert. Mental status is at baseline. He is disoriented.     Comments: Spontaneously moves all 4 extremities in symmetric fashion.   He is resisting any passive range of motion of his extremities.  To nurses and to EMTs needed to perform IV access, exam.      ED Treatments / Results  Labs (all labs ordered are listed, but only abnormal results are displayed) Labs Reviewed  CBC WITH DIFFERENTIAL/PLATELET - Abnormal; Notable for the following components:      Result Value   Neutro Abs 9.6 (*)    Lymphs Abs 0.5 (*)    Abs Immature Granulocytes 0.11 (*)    All other components within normal limits  BASIC METABOLIC PANEL - Abnormal; Notable for the following components:   CO2 20 (*)    Glucose, Bld 116 (*)    Creatinine, Ser 1.30 (*)    GFR calc non Af Amer 56 (*)    Anion gap 16 (*)    All other components within normal limits  URINALYSIS, ROUTINE W REFLEX MICROSCOPIC - Abnormal; Notable for the following components:   APPearance CLOUDY (*)    Hgb urine dipstick LARGE (*)    Protein, ur 100 (*)    All other components within normal limits  URINALYSIS, MICROSCOPIC (REFLEX) - Abnormal; Notable for the following components:   Bacteria, UA FEW (*)    All other components within normal limits  URINE CULTURE  LACTIC ACID, PLASMA  LACTIC ACID, PLASMA    EKG None  Radiology No results found.  Procedures Procedures (including critical care time)  Medications Ordered in ED Medications  enoxaparin (LOVENOX) injection 40 mg (has no administration in time range)  QUEtiapine (SEROQUEL) tablet 50 mg (has no administration in time range)  LORazepam (ATIVAN) injection 1 mg (has no administration in time range)  levothyroxine (SYNTHROID, LEVOTHROID) tablet 50 mcg (has no administration in time range)  donepezil (ARICEPT) tablet 10 mg (has no administration in time range)  simvastatin (ZOCOR) tablet 10 mg (has no administration in time range)  aspirin EC tablet 81 mg (has no administration in time range)  0.9 %  sodium chloride infusion (has no administration in time range)  LORazepam (ATIVAN) injection 1 mg (1 mg  Intravenous Given 03/04/19 1415)  sodium chloride 0.9 % bolus 500 mL (500 mLs Intravenous New Bag/Given 03/04/19 1524)     Initial Impression / Assessment and Plan / ED Course  I have reviewed the triage vital signs and the nursing notes.  Pertinent labs & imaging results that were available during my care of the patient were reviewed by me and considered in my medical decision making (see chart for details).  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  Patient is very restless here and is pulling at his cardiac leads in his IV.  His caregiver here so this is his usual behavior.  He seems to have some low abdominal tenderness and fullness.  I think would be reasonable to get him restrained and may be medicated so we can help take care of him.   [MB]  5681 Evaluated patient again after Ativan and Foley.  He is resting comfortably in bed.  Second Foley container up to 350 cc.  Belly is soft without rigidity, tenderness.   [CG]    Clinical Course User Index [CG] Kinnie Feil, PA-C [MB] Hayden Rasmussen, MD        Level 5 caveat due to underlying dementia.  History obtained from wife who is primary caregiver.  31 year old here with new, acute urinary retention.  Suprapubic abdomen is moderately distended, rigid and tender.  700 cc on bladder scan.  Unfortunately, given underlying dementia and behavioral disturbances, patient required Ativan and soft restraints to safely perform exam, obtain IV access and insert Foley.  Since restraints, he has been resting comfortably in the bed and no longer attempting to remove IV, EKG leads or Foley.  I reevaluated him and given that he was much more calm I removed one soft restraint and he has been cooperative since.  Abd exam significantly improved after foley.  Creatinine 1.30, normal GFR.  UA with few bacteria but no nitrites, WBC,  large hemoglobin/RBCs noted but this could be from Foley trauma.  There is no significant resistance with Foley insertion.  No leukocytosis.  No lactic acidosis.  CT A/P 5 years ago showed enlarged prostate.  This corroborates with DRE today.  No significant pain with prostate exam and I doubt prostatitis.  I do not think higher level of imaging is indicated emergently.  Given underlying dementia and behavioral disturbances, age, patient will likely not tolerate Foley catheter at home, he will likely remove it and become obstructed again.   Wife feels uncomfortable taking him home.  Have spoken to internal medicine team who will observe patient overnight and remove catheter to attempt voiding trial.  Appreciate Dr Junious Silk for initial recommendations.    Final diagnoses:  Urinary retention    ED Discharge Orders    None       Arlean Hopping 03/04/19 1726    Hayden Rasmussen, MD 03/04/19 1740

## 2019-03-05 ENCOUNTER — Observation Stay (HOSPITAL_COMMUNITY): Payer: Medicare (Managed Care)

## 2019-03-05 DIAGNOSIS — N1330 Unspecified hydronephrosis: Secondary | ICD-10-CM

## 2019-03-05 DIAGNOSIS — F0280 Dementia in other diseases classified elsewhere without behavioral disturbance: Secondary | ICD-10-CM

## 2019-03-05 DIAGNOSIS — N179 Acute kidney failure, unspecified: Secondary | ICD-10-CM | POA: Diagnosis not present

## 2019-03-05 DIAGNOSIS — R339 Retention of urine, unspecified: Secondary | ICD-10-CM | POA: Diagnosis not present

## 2019-03-05 DIAGNOSIS — N320 Bladder-neck obstruction: Secondary | ICD-10-CM | POA: Diagnosis not present

## 2019-03-05 LAB — BASIC METABOLIC PANEL
Anion gap: 6 (ref 5–15)
Anion gap: 9 (ref 5–15)
BUN: 12 mg/dL (ref 8–23)
BUN: 13 mg/dL (ref 8–23)
CHLORIDE: 112 mmol/L — AB (ref 98–111)
CO2: 22 mmol/L (ref 22–32)
CO2: 20 mmol/L — ABNORMAL LOW (ref 22–32)
CREATININE: 1.05 mg/dL (ref 0.61–1.24)
Calcium: 8.8 mg/dL — ABNORMAL LOW (ref 8.9–10.3)
Calcium: 8.9 mg/dL (ref 8.9–10.3)
Chloride: 113 mmol/L — ABNORMAL HIGH (ref 98–111)
Creatinine, Ser: 1.07 mg/dL (ref 0.61–1.24)
GFR calc Af Amer: >60 mL/min (ref >60)
GFR calc Af Amer: >60 mL/min (ref >60)
GFR calc non Af Amer: >60 mL/min (ref >60)
GFR calc non Af Amer: >60 mL/min (ref >60)
Glucose, Bld: 90 mg/dL (ref 70–99)
Glucose, Bld: 76 mg/dL (ref 70–99)
Potassium: 3.3 mmol/L — ABNORMAL LOW (ref 3.5–5.1)
Potassium: 3.5 mmol/L (ref 3.5–5.1)
Sodium: 141 mmol/L (ref 135–145)
Sodium: 141 mmol/L (ref 135–145)

## 2019-03-05 LAB — URINE CULTURE: Culture: NO GROWTH

## 2019-03-05 LAB — CBC
HEMATOCRIT: 40.3 % (ref 39.0–52.0)
Hemoglobin: 13.4 g/dL (ref 13.0–17.0)
MCH: 31.7 pg (ref 26.0–34.0)
MCHC: 33.3 g/dL (ref 30.0–36.0)
MCV: 95.3 fL (ref 80.0–100.0)
Platelets: 200 10*3/uL (ref 150–400)
RBC: 4.23 MIL/uL (ref 4.22–5.81)
RDW: 12.8 % (ref 11.5–15.5)
WBC: 8.7 10*3/uL (ref 4.0–10.5)
nRBC: 0 % (ref 0.0–0.2)

## 2019-03-05 MED ORDER — POTASSIUM CHLORIDE 20 MEQ/15ML (10%) PO SOLN
40.0000 meq | Freq: Once | ORAL | Status: AC
  Administered 2019-03-05: 40 meq via ORAL
  Filled 2019-03-05: qty 30

## 2019-03-05 MED ORDER — LORAZEPAM 2 MG/ML IJ SOLN: 0.2500 mg | Freq: Once | INTRAMUSCULAR | Status: DC | PRN

## 2019-03-05 MED ORDER — TAMSULOSIN HCL 0.4 MG PO CAPS
0.4000 mg | ORAL_CAPSULE | Freq: Every day | ORAL | Status: DC
  Administered 2019-03-06 – 2019-03-08 (×3): 0.4 mg via ORAL
  Filled 2019-03-05 (×3): qty 1

## 2019-03-05 MED ORDER — QUETIAPINE FUMARATE 50 MG PO TABS
50.0000 mg | ORAL_TABLET | Freq: Every evening | ORAL | Status: DC | PRN
  Administered 2019-03-06: 50 mg via ORAL
  Filled 2019-03-05: qty 1

## 2019-03-05 MED ORDER — SODIUM CHLORIDE 0.9 % IV SOLN
INTRAVENOUS | Status: AC
  Administered 2019-03-05: 14:00:00 via INTRAVENOUS

## 2019-03-05 MED ORDER — ORAL CARE MOUTH RINSE
15.0000 mL | Freq: Two times a day (BID) | OROMUCOSAL | Status: DC
  Administered 2019-03-05 – 2019-03-08 (×7): 15 mL via OROMUCOSAL

## 2019-03-05 MED ORDER — POTASSIUM CHLORIDE CRYS ER 20 MEQ PO TBCR: 40.0000 meq | EXTENDED_RELEASE_TABLET | Freq: Once | ORAL | Status: DC

## 2019-03-05 NOTE — Progress Notes (Addendum)
Subjective: No acute events overnight. Nursing reports that he slept on and off throughout the night. He did not require an prns for agitation. His foley catheter was removed at 3am. He had no urine output as of 9am. Repeat bladder scan this am showed >915ml of urine in the bladder. His wife has no updates for the team. The patient himself is more awake and alert today and is able to shake his head yes or no to questions.   I spoke with Bryan Arellano' [facility] PCP Dr. Jimmye Norman. She reports that he was seen in her clinic three weeks ago for hematuria. He passed a teaspoon of bright red blood and his urine specimen was pink tinged. Urine culture grew Group B Strep, sensitive to amoxicillin. He was treated for hemorrhagic UTI with amoxicillin, but his wife reports that he has only taken this medication sporadically.   Objective:  Vital signs in last 24 hours: Vitals:   03/04/19 1730 03/04/19 1836 03/04/19 2049 03/05/19 0609  BP: 121/81 (!) 138/92 (!) 156/95 105/61  Pulse: 97 91 85 86  Resp:  18 16 16   Temp:  98.5 F (36.9 C) (!) 97.3 F (36.3 C) 98.1 F (36.7 C)  TempSrc:  Oral Oral Oral  SpO2: 99% 100% 100% 100%   Gen: lying comfortably in bed, no distress, awake and alert, nonverbal CV: RRR, no murmurs Pulm: CTAB, normal effort on room air  Abd: suprapubic fullness, non tender   Assessment/Plan:  Active Problems:   Carotid artery stenosis, asymptomatic, bilateral   Urinary retention   Tracheostomy present (HCC)   History of head and neck cancer   Other specified hypothyroidism   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 He was found to be  retaining urine in the ED and a foley catheter was placed.   Urinary Retention Hematuria AKI - Cr elevated to 1.3 on admission, BL 1. Cr decreased to 1.07 today. Likely postrenal in the setting of obstruction, which resolved with catheterization. - Retention most likely caused by BPH. Although wife denies history of prostate issues, CT abdomen pelvis in 2013 showed prostatic enlargement.  - Hematuria pre-dates foley insertion. Hematuria may be 2/2 UTI although UA was without leukocytes or nitrites. Hematuria may also be from GU malignancy. - Renal US showed bilateral hydronephrosis. No mention of the prostate. - As patient has been unable to void after a 6hr voiding trial and bladder scan shows >939ml, urology consulted. Dr. Gloriann Loan with urology recommended replacing foley and outpatient f/u with urology in one week. He recommended against CT abdomen at this time.  - Will observe patient overnight for IVF and kidney function reassessment in the morning. Likely discharge tomorrow  - If the patient's wife feels unable to manage the foley at home, [facility] can look into rehab placement until his urology follow-up. Plan - Replace foley - Start flomax - F/u urine culture - PSA - am BMP and CBC - Outpatient f/u with urology in 1 week.  Agitation - Patient is "mellow" at baseline. He required sedation with ativan and physical restraints in order to place the foley in the ED. - No agitation overnight. He did not require prn sedation. He  may become agitated again when the foley is replaced. There is concern that he will pull out his foley and cause traumatic injury. - The patient became heavily sedated last night after ativan 1mg  yesterday. Will proceed cautiously with any sedative medications given his age and baseline dementia Plan - Dilaudid 0.4mg  q4hrs prn for pain - Ativan 0.25mg  once prn for agitation with foley placement  - May need to initiate soft restraints if patient attempts to pull out  foley   Dementia: continue home donepezil 10mg  daily Hypothyroidism: continue home synthroid 29mcg daily HLD: continue home simvastatin 10mg  daily  Dispo: Anticipated discharge pending urology recommendations.  Ladine Kiper, Andree Elk, MD 03/05/2019, 6:13 AM Pager: [phone]

## 2019-03-05 NOTE — Discharge Summary (Signed)
Name: Bryan Arellano MRN: [id number] DOB: 1951/03/13 68 y.o. PCP: Seward Carol, MD  Date of Admission: 03/04/2019 12:13 PM Date of Discharge: 03/08/2019 Attending Physician: Dr. Beryle Beams  Discharge Diagnosis: 1. Urinary retention  2. Hematuria 3. AKI 4. Agitation  Discharge Medications: Allergies as of 03/08/2019   No Known Allergies     Medication List    STOP taking these medications   divalproex 250 MG 24 hr tablet Commonly known as:  Depakote ER   HYDROcodone-acetaminophen 5-325 MG tablet Commonly known as:  Norco     TAKE these medications   acetaminophen 325 MG tablet Commonly known as:  TYLENOL Take 2 tablets (650 mg total) by mouth every 6 (six) hours as needed for mild pain (or Fever >/= 101).   aspirin 81 MG tablet Take 81 mg by mouth daily.   donepezil 10 MG tablet Commonly known as:  ARICEPT TAKE 1 TABLET BY MOUTH ONCE DAILY(MIDDAY) What changed:  See the new instructions.   levothyroxine 50 MCG tablet Commonly known as:  SYNTHROID, LEVOTHROID Take 50 mcg by mouth daily.   QUEtiapine 50 MG tablet Commonly known as:  SEROQUEL Take 1 tablet (50 mg total) by mouth daily. Start taking on:  March 09, 2019   Rocklatan 0.02-0.005 % Soln Generic drug:  Netarsudil-Latanoprost Place 1 drop into both eyes at bedtime.   simvastatin 10 MG tablet Commonly known as:  ZOCOR Take 1 tablet (10 mg total) by mouth every evening.   tamsulosin 0.4 MG Caps capsule Commonly known as:  FLOMAX Take 1 capsule (0.4 mg total) by mouth daily. Start taking on:  March 09, 2019   Vitamin D (Ergocalciferol) 1.25 MG (50000 UT) Caps capsule Commonly known as:  DRISDOL Take 50,000 Units by mouth every 30 (thirty) days.       Disposition and follow-up:   Bryan Arellano was discharged from [facility] in Good condition.  At the hospital follow up visit please address:  1. Urinary retention  - Patient discharged with foley catheter in place - PSA  elevated to 33 - Please ensure patient has appropriate f/u with urology on 3/11 - Started on 50mg  seroquel daily to prevent pulling out the foley. Patient may not require this medication if and when foley has been removed  2. Hematuria - Patient will need repeat UA in the future to ensure hematuria has resolved - May benefit from CT abdomen if it has not  3. AKI - Resolved. Cr 0.91 on discharge  4. Hypokalemia - Hypokalemic to 3 day of discharge. Repleted with oral K solution. - Please recheck K   5.  Labs / imaging needed at time of follow-up: UA, BMP  6.  Pending labs/ test needing follow-up: none  Follow-up Appointments:   Hospital Course by problem list: 1. Urinary retention: Bryan Arellano is a 68 yo male with a medical history of SCC of the soft palate diagnosed in 1999 and s/p radiationin 2000, supraglottic carcinoma s/plaryngectomyin 2018 and now non-verbal,bilateral ICA stenosis, dementia, hypothyroidism, and HLD who presented with two days of abdominal distension. He was found to be retaining urine in the ED and a foley catheter was placed. He failed a voiding trial and a coude catheter was placed. Most likely cause of retention is prostatic enlargement given PSA was 33.   2. Hematuria: Presented with gross hematuria. UA showed hematuria, but this was after cath insertion. Urine culture without growth. Pt will need f/u to ensure this hematuria resolves. Otherwise, he should  get a CT abdomen/pelvis to rule out malignancy of the GU tract.  3. AKI: Cr elevated to 1.3 on admission. Cr at discharge was 0.91. Likely postrenal 2/2 obstruction. Renal US showed bilateral hydronephrosis.   4. Agitation: Patient initially required sedation to place foley and to avoid pulling out foley. He did well with the placement of the second foley and was calm at discharge. Started on 50mg  seroquel daily to avoid pulling out catheter.  5. Hypokalemia: K was 3 the morning of discharge. Replaced  orally. Pt should have f/u BMP in 2 days with urology.  Discharge Vitals:   BP 117/78 (BP Location: Left Arm)   Pulse 77   Temp 98.4 F (36.9 C) (Oral)   Resp 16   SpO2 95%   Pertinent Labs, Studies, and Procedures:  CBC Latest Ref Rng & Units 03/05/2019 03/04/2019 12/12/2018  WBC 4.0 - 10.5 K/uL 8.7 10.4 8.9  Hemoglobin 13.0 - 17.0 g/dL 13.4 16.0 15.9  Hematocrit 39.0 - 52.0 % 40.3 47.3 47.8  Platelets 150 - 400 K/uL 200 PLATELET CLUMPS NOTED ON SMEAR, UNABLE TO ESTIMATE 265   CMP Latest Ref Rng & Units 03/08/2019 03/05/2019 03/05/2019  Glucose 70 - 99 mg/dL 88 76 90  BUN 8 - 23 mg/dL 9 13 12   Creatinine 0.61 - 1.24 mg/dL 0.91 1.05 1.07  Sodium 135 - 145 mmol/L 139 141 141  Potassium 3.5 - 5.1 mmol/L 3.0(L) 3.5 3.3(L)  Chloride 98 - 111 mmol/L 106 112(H) 113(H)  CO2 22 - 32 mmol/L 24 20(L) 22  Calcium 8.9 - 10.3 mg/dL 8.7(L) 8.9 8.8(L)  Total Protein 6.5 - 8.1 g/dL - - -  Total Bilirubin 0.3 - 1.2 mg/dL - - -  Alkaline Phos 38 - 126 U/L - - -  AST 15 - 41 U/L - - -  ALT 0 - 44 U/L - - -   Renal US 03/05/2019 1. Bilateral mild hydronephrosis. 2. Consider further evaluation with CT of the abdomen and pelvis without intravenous contrast. 3. LEFT renal cyst.  Discharge Instructions: Discharge Instructions    Discharge instructions   Complete by:  As directed    It was a pleasure taking care of you in the hospital, [name].  You were hospitalized for urinary retention and a urinary catheter had to be placed. You should follow-up with urology (the bladder specialists) on Wednesday 3/11. Keep the foley in until that time. If you remove the foley, you could cause damage to your bladder and penis.   Thanks, Dr. Annie Paras   Increase activity slowly   Complete by:  As directed       Signed: Dorrell, Andree Elk, MD 03/08/2019, 11:52 AM   Pager: [phone]

## 2019-03-05 NOTE — Progress Notes (Signed)
Renal ultrasound this morning. Report called to RN. Bilateral mild hydronephrosis. Consider full evaluation with CT of abdomen/pelvis without IV contrast. See Epic note for full report and measurements.

## 2019-03-06 DIAGNOSIS — Z923 Personal history of irradiation: Secondary | ICD-10-CM | POA: Diagnosis not present

## 2019-03-06 DIAGNOSIS — Z9221 Personal history of antineoplastic chemotherapy: Secondary | ICD-10-CM | POA: Diagnosis not present

## 2019-03-06 DIAGNOSIS — Z85818 Personal history of malignant neoplasm of other sites of lip, oral cavity, and pharynx: Secondary | ICD-10-CM | POA: Diagnosis not present

## 2019-03-06 DIAGNOSIS — G300 Alzheimer's disease with early onset: Secondary | ICD-10-CM | POA: Diagnosis not present

## 2019-03-06 DIAGNOSIS — Z8521 Personal history of malignant neoplasm of larynx: Secondary | ICD-10-CM | POA: Diagnosis not present

## 2019-03-06 DIAGNOSIS — F0281 Dementia in other diseases classified elsewhere with behavioral disturbance: Secondary | ICD-10-CM | POA: Diagnosis present

## 2019-03-06 DIAGNOSIS — R339 Retention of urine, unspecified: Secondary | ICD-10-CM | POA: Diagnosis present

## 2019-03-06 DIAGNOSIS — Z818 Family history of other mental and behavioral disorders: Secondary | ICD-10-CM | POA: Diagnosis not present

## 2019-03-06 DIAGNOSIS — N401 Enlarged prostate with lower urinary tract symptoms: Secondary | ICD-10-CM | POA: Diagnosis present

## 2019-03-06 DIAGNOSIS — Z930 Tracheostomy status: Secondary | ICD-10-CM | POA: Diagnosis not present

## 2019-03-06 DIAGNOSIS — Z87891 Personal history of nicotine dependence: Secondary | ICD-10-CM | POA: Diagnosis not present

## 2019-03-06 DIAGNOSIS — E038 Other specified hypothyroidism: Secondary | ICD-10-CM | POA: Diagnosis present

## 2019-03-06 DIAGNOSIS — Z9002 Acquired absence of larynx: Secondary | ICD-10-CM | POA: Diagnosis not present

## 2019-03-06 DIAGNOSIS — R319 Hematuria, unspecified: Secondary | ICD-10-CM | POA: Diagnosis not present

## 2019-03-06 DIAGNOSIS — N138 Other obstructive and reflux uropathy: Secondary | ICD-10-CM | POA: Diagnosis present

## 2019-03-06 DIAGNOSIS — Z8249 Family history of ischemic heart disease and other diseases of the circulatory system: Secondary | ICD-10-CM | POA: Diagnosis not present

## 2019-03-06 DIAGNOSIS — Z87440 Personal history of urinary (tract) infections: Secondary | ICD-10-CM | POA: Diagnosis not present

## 2019-03-06 DIAGNOSIS — E785 Hyperlipidemia, unspecified: Secondary | ICD-10-CM | POA: Diagnosis present

## 2019-03-06 DIAGNOSIS — Z781 Physical restraint status: Secondary | ICD-10-CM | POA: Diagnosis not present

## 2019-03-06 DIAGNOSIS — R338 Other retention of urine: Secondary | ICD-10-CM | POA: Diagnosis present

## 2019-03-06 DIAGNOSIS — N179 Acute kidney failure, unspecified: Secondary | ICD-10-CM | POA: Diagnosis present

## 2019-03-06 DIAGNOSIS — Q6101 Congenital single renal cyst: Secondary | ICD-10-CM | POA: Diagnosis not present

## 2019-03-06 DIAGNOSIS — Z95828 Presence of other vascular implants and grafts: Secondary | ICD-10-CM | POA: Diagnosis not present

## 2019-03-06 DIAGNOSIS — R310 Gross hematuria: Secondary | ICD-10-CM | POA: Diagnosis present

## 2019-03-06 DIAGNOSIS — N1330 Unspecified hydronephrosis: Secondary | ICD-10-CM | POA: Diagnosis present

## 2019-03-06 DIAGNOSIS — E876 Hypokalemia: Secondary | ICD-10-CM | POA: Diagnosis not present

## 2019-03-06 DIAGNOSIS — G309 Alzheimer's disease, unspecified: Secondary | ICD-10-CM | POA: Diagnosis present

## 2019-03-06 LAB — PSA, TOTAL AND FREE
PSA FREE PCT: 16.1 %
PSA, Free: 5.32 ng/mL
Prostate Specific Ag, Serum: 33 ng/mL — ABNORMAL HIGH (ref 0.0–4.0)

## 2019-03-06 MED ORDER — QUETIAPINE FUMARATE 50 MG PO TABS
50.0000 mg | ORAL_TABLET | Freq: Once | ORAL | Status: AC
  Administered 2019-03-06: 50 mg via ORAL
  Filled 2019-03-06: qty 1

## 2019-03-06 NOTE — Progress Notes (Signed)
Per conversation with L. Harbrecht, MD the patient's soft restraints have been removed, safety mitts still in use.  The patient's wife is in the room and agreed to removal.  Will continue to monitor for safety.

## 2019-03-06 NOTE — Progress Notes (Signed)
Wife reported pt took off mittens at 2145 and was trying to mess with the foley, NT came in and placed mittens back. Pt currently resting in bed with eyes closed. Will cont to monitor.

## 2019-03-06 NOTE — Social Work (Signed)
If pt requires SNF placement we will need PT/OT orders as well as to coordinate this with pt team at [facility]. MD team paged for these orders. Pt will also have to be free of restraints for 24 hours if going to a SNF. This means no telesitter, mittens, physical sitter or soft restraints.   Please contact [facility] with any additional questions. [phone].  Westley Hummer, MSW, [place] Work

## 2019-03-06 NOTE — Progress Notes (Signed)
   Subjective: The patient was lying comfortably in his bed today upon entering the room.  He was able to nod his head appropriately when spoken to.  He denied headache, he denied chest pain, he denied abdominal or bladder pain.  When asked if he will remove the catheter if his restraints are removed he shrugs his shoulders.  As per wife patient will undoubtedly remove the catheter with restraints are removed as he did attempt to do this all throughout the night.  She feels that he will remove it once the restraints are removed before he makes at home.   Objective:  Vital signs in last 24 hours: Vitals:   03/05/19 1453 03/05/19 2120 03/05/19 2248 03/06/19 0443  BP: 117/85  118/78 104/66  Pulse: 79 81 74 77  Resp: 17 18 18 16   Temp:   97.9 F (36.6 C) 97.7 F (36.5 C)  TempSrc:   Oral Oral  SpO2: 100% 95% 99% 99%   General: A/O x4, in no acute distress, afebrile, nondiaphoretic Cardio: RRR, no mrg's  Pulmonary: CTA bilaterally, no wheezing or crackles  Abdomen: Bowel sounds normal, soft, nontender MSK: BLE nontender, nonedematous  Assessment/Plan:  Active Problems:   Carotid artery stenosis, asymptomatic, bilateral   Urinary retention   Tracheostomy present ([place])   History of head and neck cancer   Other specified hypothyroidism   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 He was found to be retaining urine in the ED and a foley catheter was placed.   Urinary retention/hematuria/AKI: AKI is now resolved.  Serum creatinine 1.05.  Agree with probable post renal in setting of obstruction  which resolved with catheterization placement.  For the pressures with most likely secondary to BPH but will obtain a PSA. -Continue Foley to 1 week follow-up with urology - Continue Flomax to follow-up with urology - Advised repeat BMP in 1 week -Plan for discharge home today versus rehab placement until urology follow-up pending discussion with family. I have consulted social work to assist with placement.  -Follow-up PSA  Agitation: This is a new finding most likely secondary to pain and Foley placement.  He continues to remain free of agitation/or aggression and has not required PRN sedation but does require soft restraints to keep from removing the urinary catheter.  Continue Dilaudid 0.4 mg every 4 hours as needed pain Page MD for agitation prior to medication administration.   Dementia: Continue home donepezil 10 mg daily Hypothyroidism: Continue Synthroid HLD: Continue simvastatin  Diet: Soft Code: Full Fluids: N/A Dispo: Anticipated discharge in approximately 0-1 day(s).   Kathi Ludwig, MD 03/06/2019, 6:49 AM Pager: Pager# [phone]

## 2019-03-07 DIAGNOSIS — G300 Alzheimer's disease with early onset: Secondary | ICD-10-CM

## 2019-03-07 MED ORDER — QUETIAPINE FUMARATE 50 MG PO TABS
50.0000 mg | ORAL_TABLET | Freq: Every day | ORAL | Status: DC
  Administered 2019-03-07 – 2019-03-08 (×2): 50 mg via ORAL
  Filled 2019-03-07 (×2): qty 1

## 2019-03-07 NOTE — Plan of Care (Signed)
  Problem: Pain Managment: Goal: General experience of comfort will improve Outcome: Progressing   Problem: Skin Integrity: Goal: Risk for impaired skin integrity will decrease Outcome: Progressing   

## 2019-03-07 NOTE — Progress Notes (Signed)
   Subjective: The patient was lying in his bed today upon entering the room. He continues to be preoccupied with the urinary catheter constantly touching it. He has yet to attempt to completely remove the catheter and nods appropriately that he understands it is to remain in place. He shakes his head when asked if he is in pain near the catheter or if it causes him discomfort. He nods that it "annoys" him. Patient and wife in agreement with attempting to remove the mittens so that he may go to a SNF while awaiting his follow-up with urology his wife does not feel that she can care for him at home with a catheter in place.  They are in agreement with this plan.  Objective:  Vital signs in last 24 hours: Vitals:   03/06/19 2039 03/06/19 2050 03/07/19 0606 03/07/19 0859  BP: 104/71  120/74   Pulse: 72 76 75 78  Resp: 15 16 16 18   Temp: 98.3 F (36.8 C)  98.3 F (36.8 C)   TempSrc: Oral  Oral   SpO2: 99%  99% 98%   General: In no acute distress, afebrile, nondiaphoretic Cardio: RRR, no mrg's  Pulmonary: CTA bilaterally, no wheezing or crackles  Abdomen: Bowel sounds normal, soft, nontender  GU: Foley catheter in place no bleeding at the urethral meatus MSK: BLE nontender, nonedematous Psych: Appropriate affect, not depressed in appearance  Assessment/Plan:  Active Problems:   Carotid artery stenosis, asymptomatic, bilateral   Urinary retention   Tracheostomy present ([place])   History of head and neck cancer   Other specified hypothyroidism   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 He was  found to be retaining urine in the ED and a foley catheter was placed.   He, however, did not tolerate the Foley catheter placement well and has attempted to remove this multiple times complicating his treatment.  Urinary retention/hematuria/AKI: AKI is now resolved.  Held morning BMP per patient and wife's request. Serum creatinine 1.05 03/05/2019.  Agree with probable post renal AKI in setting of obstruction which resolved with catheterization placement.   -Continue Foley for 1 week follow-up with urology -Continue Flomax until follow-up with urology - Advised repeat BMP in 1 week -Plan for discharge to inpatient rehabilitation once patient has been out of restraints, out of mittens for 24 hours -PSA markedly elevated at 33, however caution is this was taken following traumatic Foley catheter placement.  Percent free PSA 16.1%.   Agitation: Resolved.  Most likely secondary to pain and traumatic Foley catheter placement.  He continues to remain free of agitation/or aggression and has not required PRN sedation.  Continue Dilaudid 0.4 mg every 4 hours as needed pain Page MD for agitation prior to medication administration.   Dementia: Continue home donepezil 10 mg daily Hypothyroidism: Continue Synthroid HLD: Continue simvastatin  Diet: Soft Code: Full Fluids: N/A Dispo: Anticipated discharge in approximately 1 day(s).   Kathi Ludwig, MD 03/07/2019, 9:30 AM Pager: Pager# [phone]

## 2019-03-07 NOTE — Evaluation (Signed)
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  Found to be retaining urine in ED, foley catheter was placed.    Clinical Impression   PTA spouse reports patient completing mobility with supervision (no AD), bathing with min assist standing in shower and dressing with supervision.  Patient admitted for above and limited by impaired balance, decreased activity tolerance, and decreased safety awareness. Able to complete bed mobility with supervision, ADls with min-supervision assist but requires maximal cueing to avoid pulling at foley cath and min guard for mild unsteadiness during functional in room mobility (during turns and on uneven surfaces).  Based on performance today, recommend 24/7 support and short term SNF rehab to maximize independence and safety with ADLs/mobility prior to returning home with spouses support. If family declines SNF, recommend maximal HH services.     Follow Up Recommendations  SNF;Supervision/Assistance - 24 hour    Equipment Recommendations  None recommended by OT    Recommendations for Other Services PT consult     Precautions / Restrictions Precautions Precautions: Fall Restrictions Weight Bearing Restrictions: No      Mobility Bed Mobility Overal bed mobility: Needs Assistance Bed Mobility: Supine to Sit;Sit to Supine     Supine to sit: Supervision Sit to supine: Supervision   General bed mobility comments: supervision for safety and avoiding pulling at foley cath, increased time required  Transfers Overall transfer level: Needs assistance Equipment used:  None Transfers: Sit to/from Stand Sit to Stand: Min guard         General transfer comment: min guard for safety and balance, mild unsteadiness     Balance Overall balance assessment: Mild deficits observed, not formally tested(during turns and uneven surfaces (into bathroom))                                         ADL either performed or assessed with clinical judgement   ADL Overall ADL's : Needs assistance/impaired     Grooming: Wash/dry hands;Min guard;Standing   Upper Body Bathing: Set up;Sitting   Lower Body Bathing: Sit to/from stand;Min guard   Upper Body Dressing : Minimal assistance;Sitting   Lower Body Dressing: Min guard;Sit to/from stand   Toilet Transfer: Min guard;Ambulation Toilet Transfer Details (indicate cue type and reason): simulated to/from EOB         Functional mobility during ADLs: Min guard;Cueing for safety;Cueing for sequencing General ADL Comments: min guard for mobility with slight unsteadiness on uneven surfaces and when turning; cueing to avoid pulling at foley throughout session      Vision   Additional Comments: further assessment required     Perception     Praxis      Pertinent Vitals/Pain Pain Assessment: Faces Faces Pain Scale: No hurt     Hand Dominance Right   Extremity/Trunk Assessment Upper Extremity Assessment Upper Extremity Assessment: Overall WFL for tasks assessed   Lower Extremity Assessment Lower Extremity Assessment: Defer to PT evaluation       Communication Communication Communication: Other (comment)(non verbal, nods yes/no )   Cognition Arousal/Alertness:  Lethargic Behavior During Therapy: Restless;Agitated;Flat affect Overall Cognitive Status: History of cognitive impairments - at baseline                                 General Comments: history of dementia at baseline, able to follow simple 1 step commands with increased time; max cueing for safety    General Comments  spouse present and supportive    Exercises     Shoulder Instructions      Home Living Family/patient expects to be discharged to:: Private residence Living Arrangements: Spouse/significant other Available Help at Discharge: Family;Available 24 hours/day Type of Home: House Home Access: Stairs to enter [employer] of Steps: 9 Entrance Stairs-Rails: Left Home Layout: One level     Bathroom Shower/Tub: Teacher, early years/pre: Standard     Home Equipment: Civil engineer, contracting   Additional Comments: [facility] monday and wed--adult daycare      Prior Functioning/Environment Level of Independence: Needs assistance  Gait / Transfers Assistance Needed: supervision for mobility  ADL's / Homemaking Assistance Needed: requires supervision for bathing and dressing (able to dress, but spouse assist to ensure he takes of old clothing first), inconsistent incontience              OT Problem List: Decreased activity tolerance;Decreased safety awareness;Decreased knowledge of use of DME or AE;Decreased knowledge of precautions;Impaired balance (sitting and/or standing)      OT Treatment/Interventions: Self-care/ADL training;DME and/or AE instruction;Therapeutic activities;Patient/family education;Balance training    OT Goals(Current goals can be found in the care plan section) Acute Rehab OT Goals Patient Stated Goal: to take him home  OT Goal Formulation: With family Time For Goal Achievement: 03/21/19 Potential to Achieve Goals: Good  OT Frequency: Min 2X/week   Barriers to D/C:            Co-evaluation              AM-PAC OT "6 Clicks" Daily Activity     Outcome Measure Help from another person eating meals?: None Help from another person taking care of personal grooming?: A Little Help from another person toileting, which includes using toliet, bedpan, or urinal?: A Little Help from another person bathing (including washing, rinsing,  drying)?: A Little Help from another person to put on and taking off regular upper body clothing?: A Little Help from another person to put on and taking off regular lower body clothing?: A Little 6 Click Score: 19   End of Session Equipment Utilized During Treatment: Gait belt Nurse Communication: Mobility status;Precautions  Activity Tolerance: Patient tolerated treatment well Patient left: in bed;with call bell/phone within reach;with bed alarm set  OT Visit Diagnosis: Unsteadiness on feet (R26.81)                Time: 4825-0037 OT Time Calculation (min): 23 min Charges:  OT General Charges $OT Visit: 1 Visit OT Evaluation $OT Eval Low Complexity: 1 Low OT Treatments $Self Care/Home Management : 8-22 mins  Delight Stare, OT Acute Rehabilitation Services Pager [phone] Office [phone]   Delight Stare 03/07/2019, 4:51 PM

## 2019-03-07 NOTE — Progress Notes (Signed)
Removed mittens at 0900 and monitoring. Patient and family are educated about keeping hand away from perineal area.

## 2019-03-08 DIAGNOSIS — N179 Acute kidney failure, unspecified: Secondary | ICD-10-CM

## 2019-03-08 LAB — BASIC METABOLIC PANEL
Anion gap: 9 (ref 5–15)
BUN: 9 mg/dL (ref 8–23)
CO2: 24 mmol/L (ref 22–32)
Calcium: 8.7 mg/dL — ABNORMAL LOW (ref 8.9–10.3)
Chloride: 106 mmol/L (ref 98–111)
Creatinine, Ser: 0.91 mg/dL (ref 0.61–1.24)
GFR calc Af Amer: >60 mL/min (ref >60)
GFR calc non Af Amer: >60 mL/min (ref >60)
Glucose, Bld: 88 mg/dL (ref 70–99)
Potassium: 3 mmol/L — ABNORMAL LOW (ref 3.5–5.1)
Sodium: 139 mmol/L (ref 135–145)

## 2019-03-08 MED ORDER — TAMSULOSIN HCL 0.4 MG PO CAPS: 0.4000 mg | ORAL_CAPSULE | Freq: Every day | ORAL | 0 refills | Status: AC

## 2019-03-08 MED ORDER — POTASSIUM CHLORIDE 20 MEQ/15ML (10%) PO SOLN
40.0000 meq | Freq: Every day | ORAL | Status: DC
  Administered 2019-03-08: 40 meq via ORAL
  Filled 2019-03-08: qty 30

## 2019-03-08 MED ORDER — QUETIAPINE FUMARATE 50 MG PO TABS: 50.0000 mg | ORAL_TABLET | Freq: Every day | ORAL | 0 refills | Status: DC

## 2019-03-08 MED ORDER — TAMSULOSIN HCL 0.4 MG PO CAPS: 0.4000 mg | ORAL_CAPSULE | Freq: Every day | ORAL | 0 refills | Status: DC

## 2019-03-08 MED ORDER — POTASSIUM CHLORIDE CRYS ER 20 MEQ PO TBCR: 40.0000 meq | EXTENDED_RELEASE_TABLET | Freq: Two times a day (BID) | ORAL | Status: DC

## 2019-03-08 MED ORDER — POTASSIUM CHLORIDE 20 MEQ/15ML (10%) PO SOLN
40.0000 meq | Freq: Two times a day (BID) | ORAL | Status: DC
  Administered 2019-03-08: 40 meq via ORAL
  Filled 2019-03-08: qty 30

## 2019-03-08 NOTE — Progress Notes (Signed)
   Subjective: Bryan Arellano did not pull at his catheter overnight. He is not in any pain this morning. We reiterated again the importance of leaving the foley in until he can follow-up with urology. His wife feels comfortable taking him home today and would prefer that to SNF.  Objective:  Vital signs in last 24 hours: Vitals:   03/07/19 1321 03/07/19 2018 03/07/19 2124 03/08/19 0508  BP: 118/64 107/68  117/78  Pulse: 90 79 85 74  Resp: 18 16 20 15   Temp: 98.8 F (37.1 C) 98.2 F (36.8 C)  98.4 F (36.9 C)  TempSrc: Oral Oral  Oral  SpO2: 100% 95% 95% 99%   General: awake, alert, lying in bed in NAD CV: RRR; no murmurs Pulm: normal respiratory effort; lungs CTAB Abd: soft, non-tender, non-distended Ext: no edema   Assessment/Plan:  Active Problems:   Carotid artery stenosis, asymptomatic, bilateral   Urinary retention   Tracheostomy present (HCC)   History of head and neck cancer   Other specified hypothyroidism   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 He was found to be retaining urine in the ED and a foley catheter was placed. He did not tolerate the Foley catheter placement well and initially attempted to remove this multiple times requiring soft restraints.   Urinary retention/hematuria/AKI: - AKI now resolved. Cr 0.91 this am.  - PSA markedly elevated at 33. While this lab was collected in the setting of foley placement, this likely represents a pathological process. - Patient has not tried to remove foley for over 24 hours and appears calm.  Plan - Discharge home with  foley - F/u with urology in two days on 3/11 - Continue Flomax until follow-up with urology - Repeat BMP at urology f/u - Replace K today  Agitation: - Resolved.  Most likely secondary to pain and traumatic Foley catheter placement. All restraints were discontinued yesterday at 9am and did not require replacement. He has not required prn sedation. Plan - Continue Seroquel 50mg  daily while foley is in place  Dementia: Continue home donepezil 10 mg daily Hypothyroidism: Continue synthroid HLD: Continue simvastatin  Dispo: Anticipated discharge today.  Labaron Digirolamo, Andree Elk, MD 03/08/2019, 6:35 AM Pager: [phone]

## 2019-03-08 NOTE — Progress Notes (Signed)
No issues overnight. Pt easily oriented. Pt slept all throughout the night.

## 2019-03-08 NOTE — Evaluation (Signed)
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  Found to be retaining urine in ED, foley catheter was placed.    Clinical Impression   Pt received in bed, wife in the room. History per chart review and confirmation per pt's wife. Pt willing to participate in therapy with encouragement. Functional mobility overall supervision to min guard for safety; pt is moving well-main limitation is cognitive status as pt has dementia at baseline.  Discussed with pt's wife and she reports she feels comfortable being his caregiver at home, and based on pt's current functional mobility status, recommending that he is safe to DC home. [facility], and would benefit from increased frequency to maximize his independence with functional mobility and safety. Discussed plans with pt's wife and with Lavella Hammock, social worker from [employer], together in pt's room.  Pt will continue to benefit from skilled acute PT services in order to improve functional mobility and safety and ensure safe DC home.     Follow Up Recommendations Other (comment);Supervision/Assistance - 24 hour(continue with PT services at [facility]. increase frequency )    Equipment Recommendations  None recommended by PT    Recommendations for Other Services       Precautions / Restrictions Precautions Precautions: Fall Restrictions Weight Bearing Restrictions: No      Mobility  Bed Mobility Overal bed mobility: Needs Assistance Bed Mobility: Supine to Sit;Sit to Supine     Supine to sit: Supervision;HOB elevated Sit to supine: Supervision;HOB  elevated   General bed mobility comments: Supervision for safety, verbal cues for encouragement, HOB elevated  Transfers Overall transfer level: Needs assistance Equipment used: None   Sit to Stand: Min guard         General transfer comment: Min guard for safety, balance  Ambulation/Gait Ambulation/Gait assistance: Min guard Gait Distance (Feet): 500 Feet Assistive device: None Gait Pattern/deviations: Step-through pattern;Decreased stride length Gait velocity: decreased   General Gait Details: Ambulated approximately 500 ft with min guard for safety, verbal cues for direction, encouragement via music (jazz) and hand held leading, no LOB with no AD, mild unsteadiness at times suspect due to cognitive deficits  Stairs            Wheelchair Mobility    Modified Rankin (Stroke Patients Only)       Balance Overall balance assessment: Needs assistance Sitting-balance support: Feet supported;No upper extremity supported Sitting balance-Leahy Scale: Good     Standing balance support: No upper extremity supported;During functional activity Standing balance-Leahy Scale: Good Standing balance comment: mildly unsteady during turns; no LOB but close guard for safety. Two instances of pt confused and attempted to sit down with no seat behind him when looking for his shoes- manual and verbal cues to remain upright                             Pertinent Vitals/Pain Pain Assessment: Faces Faces Pain Scale: No hurt    Home Living Family/patient expects to be discharged to:: Private residence Living Arrangements: Spouse/significant other Available Help at Discharge: Family;Available 24 hours/day Type of Home: House  Home Access: Stairs to enter Entrance Stairs-Rails: Left Entrance Stairs-Number of Steps: 9 Home Layout: One level Home Equipment: Shower seat Additional Comments: [facility] monday and wed--adult daycare    Prior Function Level of Independence: Needs  assistance   Gait / Transfers Assistance Needed: supervision for mobility   ADL's / Homemaking Assistance Needed: requires supervision for bathing and dressing (able to dress, but spouse assist to ensure he takes of old clothing first), inconsistent incontience          Hand Dominance   Dominant Hand: Right    Extremity/Trunk Assessment   Upper Extremity Assessment Upper Extremity Assessment: Defer to OT evaluation    Lower Extremity Assessment Lower Extremity Assessment: Overall WFL for tasks assessed    Cervical / Trunk Assessment Cervical / Trunk Assessment: Normal  Communication   Communication: Other (comment)(nonverbal, nods yes or no)  Cognition Arousal/Alertness: Awake/alert Behavior During Therapy: Flat affect;WFL for tasks assessed/performed Overall Cognitive Status: History of cognitive impairments - at baseline                                 General Comments: History of dementia at baseline; follows simple 1 step commands with encouragement      General Comments      Exercises     Assessment/Plan    PT Assessment Patient needs continued PT services  PT Problem List Decreased balance;Decreased cognition;Decreased mobility;Decreased coordination;Decreased safety awareness       PT Treatment Interventions Functional mobility training;Balance training;Patient/family education;Gait training;Therapeutic activities;Neuromuscular re-education;Stair training;Therapeutic exercise    PT Goals (Current goals can be found in the Care Plan section)  Acute Rehab PT Goals Patient Stated Goal: to take him home  PT Goal Formulation: With family Time For Goal Achievement: 03/22/19 Potential to Achieve Goals: Good    Frequency Min 3X/week   Barriers to discharge        Co-evaluation               AM-PAC PT "6 Clicks" Mobility  Outcome Measure Help needed turning from your back to your side while in a flat bed without using bedrails?:  None Help needed moving from lying on your back to sitting on the side of a flat bed without using bedrails?: None Help needed moving to and from a bed to a chair (including a wheelchair)?: A Little Help needed standing up from a chair using your arms (e.g., wheelchair or bedside chair)?: None Help needed to walk in hospital room?: A Little Help needed climbing 3-5 steps with a railing? : A Little 6 Click Score: 21    End of Session Equipment Utilized During Treatment: Gait belt Activity Tolerance: Patient tolerated treatment well Patient left: in bed;with call bell/phone within reach;with bed alarm set;with family/visitor present Nurse Communication: Mobility status PT Visit Diagnosis: Unsteadiness on feet (R26.81)    Time: 1610-9604 PT Time Calculation (min) (ACUTE ONLY): 38 min   Charges:   PT Evaluation $PT Eval Moderate Complexity: 1 Mod PT Treatments $Gait Training: 23-37 mins        Ronnell Guadalajara, SPT   Ronnell Guadalajara 03/08/2019, 11:30 AM

## 2019-03-08 NOTE — Progress Notes (Signed)
Trach airway documented on 03/05/19 removed due to no trach.  Pt has a stoma open to air.  RT will continue to monitor.

## 2019-03-08 NOTE — Social Work (Signed)
CSW spoke with pt CSW through [place], [place] [phone]), pt wife prefers to take pt home with assistance from [facility] and family. She does NOT want SNF at this time. Pt is able to have more services through [facility] at this time- including more Home Health and time at the day program.   Lavella Hammock requests therapy notes be sent to [phone]. Pt wife will transport pt; she is declining [facility] transportation home.  At this time CSW signing off. Please consult if any additional needs arise.  Alexander Mt, [place] Work [phone]

## 2019-03-08 NOTE — Progress Notes (Signed)
Juanda Chance to be D/C'd  per MD order. Discussed with the patient's wife  and all questions fully answered.  VSS, Skin clean, dry and intact without evidence of skin break down, no evidence of skin tears noted.  IV catheter discontinued intact. Site without signs and symptoms of complications. Dressing and pressure applied.  An After Visit Summary was printed and given to the patient. Patient received prescription.  D/c education completed with patient/family including follow up instructions, medication list, d/c activities limitations if indicated, with other d/c instructions as indicated by MD - patient able to verbalize understanding, all questions fully answered.   Patient instructed to return to ED, call 911, or call MD for any changes in condition.   Patient to be escorted via [place], and D/C home via private auto.

## 2019-03-12 IMAGING — CR DG ABDOMEN ACUTE W/ 1V CHEST
3 series · 3 of 3 positions shown · non-contrast
Comparison: 09/18/2017

CLINICAL DATA: Not feeling well, sleeping

EXAM:
DG ABDOMEN ACUTE W/ 1V CHEST

[x chest ap]
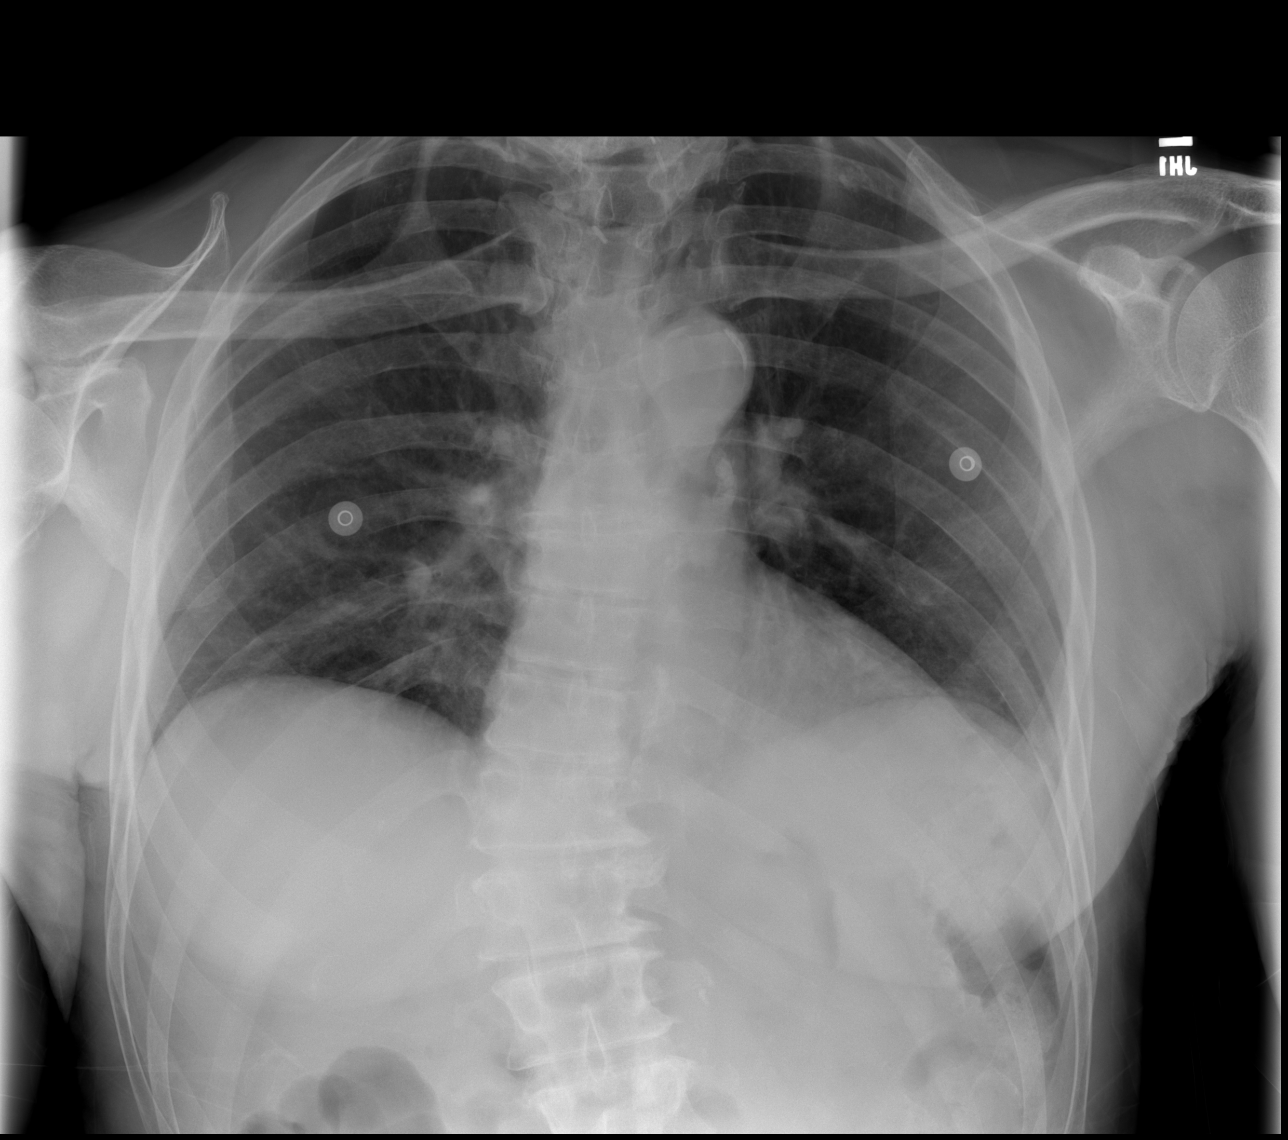

[x abdomen erect]
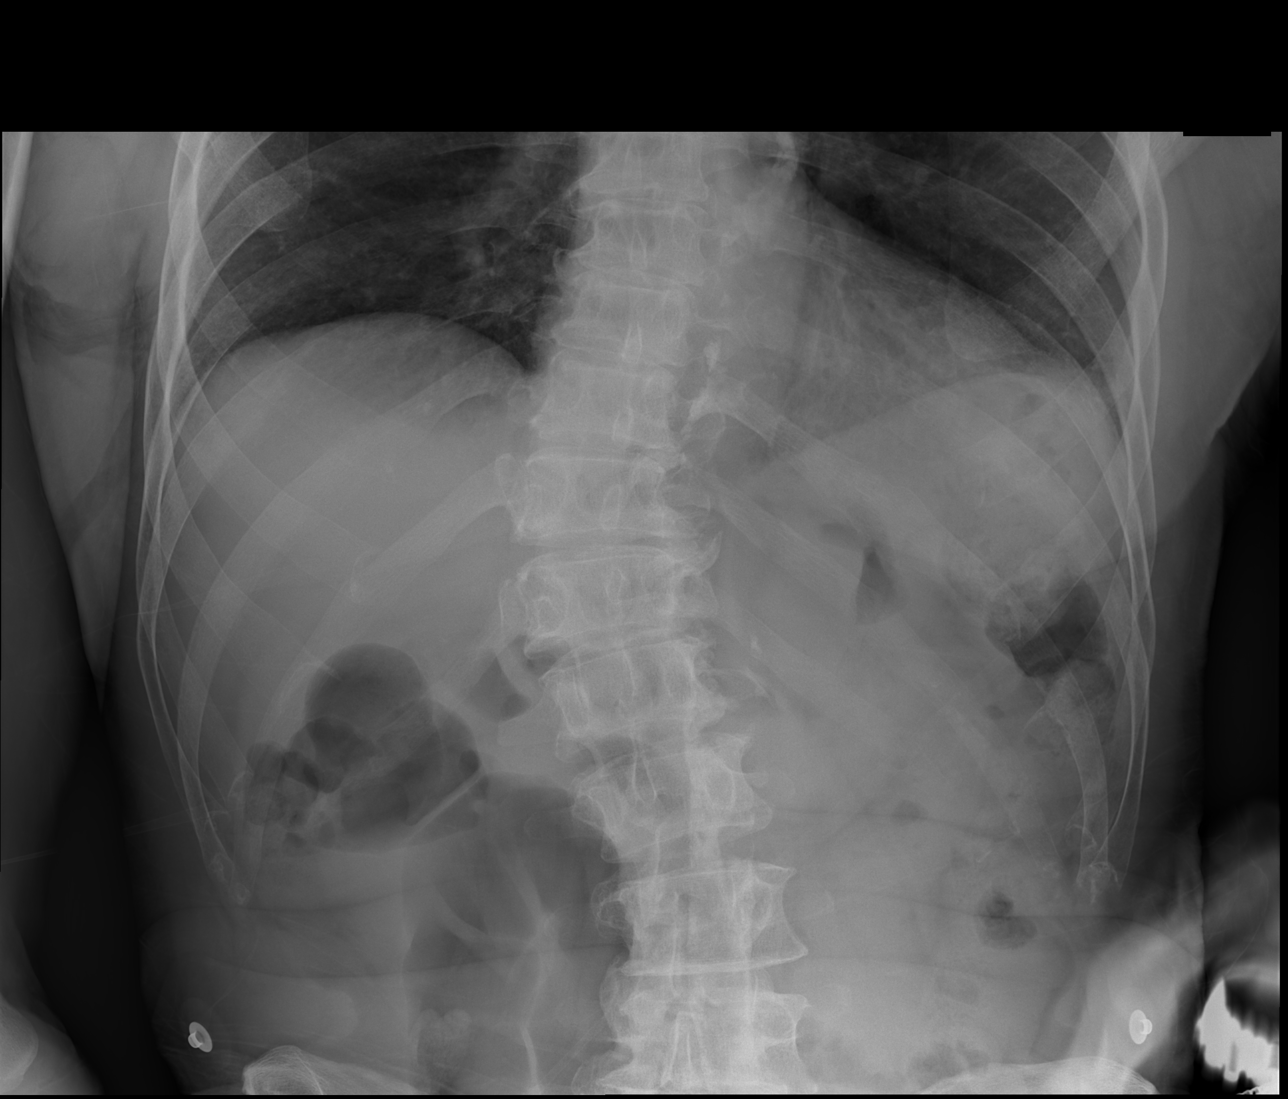

[x abdomen supine]
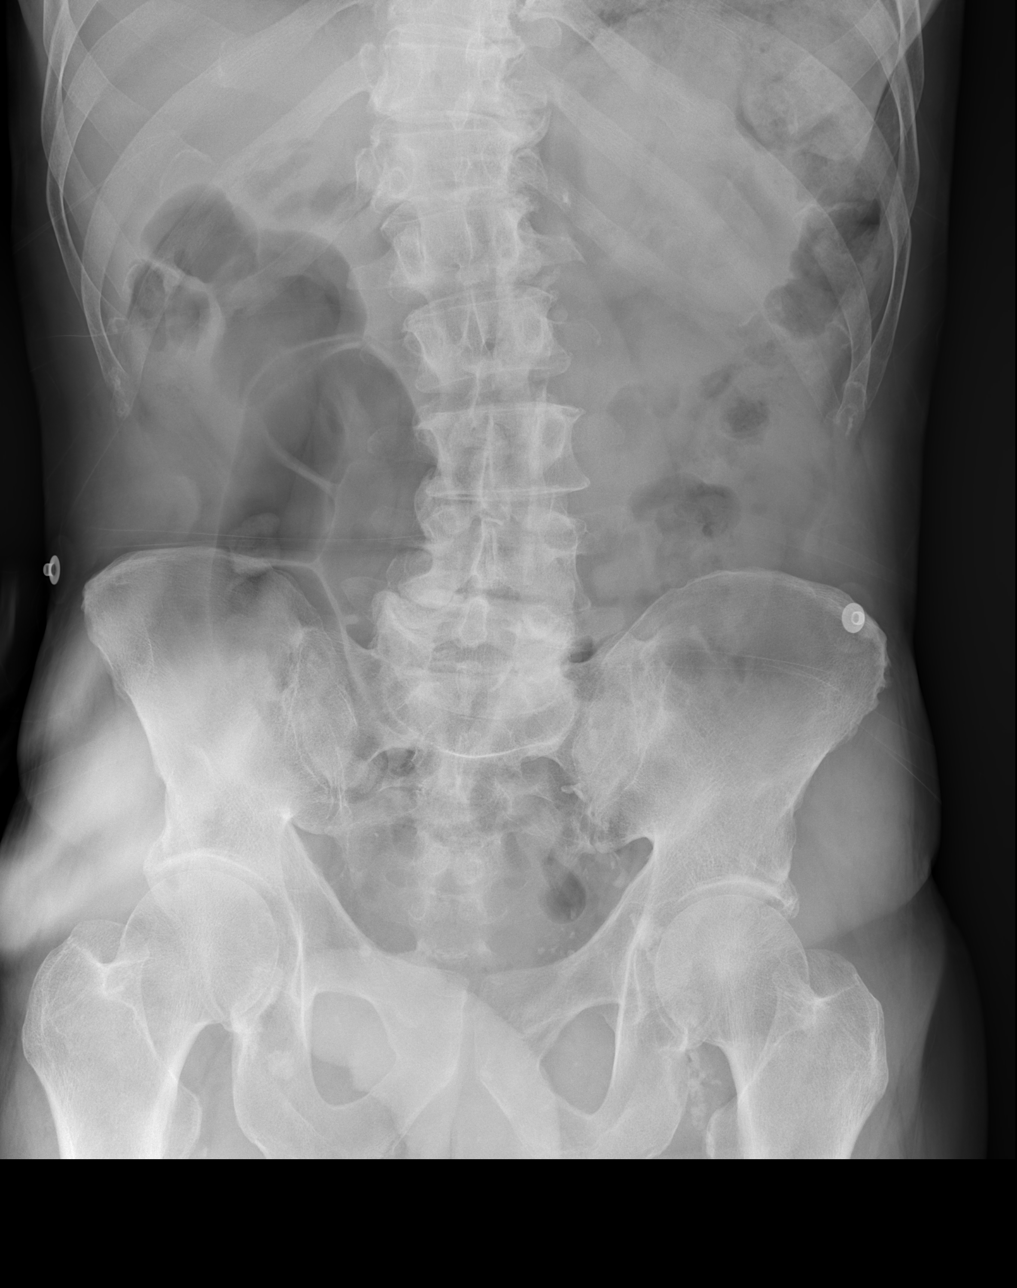

[3 of 3 positions shown; findings below may reference images not displayed]

FINDINGS: Heart is borderline in size. No confluent airspace opacities,
effusions or edema.

Nonobstructive bowel gas pattern. No free air or organomegaly.
Vascular calcifications in the pelvis.

Thoracolumbar scoliosis and degenerative changes.
IMPRESSION: No acute cardiopulmonary disease.  Borderline cardiomegaly.

No evidence of bowel obstruction or free air.

## 2019-03-18 ENCOUNTER — Inpatient Hospital Stay (HOSPITAL_COMMUNITY)
Admission: EM | Admit: 2019-03-18 | Discharge: 2019-03-23 | DRG: 872 | Disposition: A | Discharge status: Home / Self Care | Payer: Medicare (Managed Care) | Attending: Internal Medicine | Admitting: Internal Medicine

## 2019-03-18 ENCOUNTER — Encounter (HOSPITAL_COMMUNITY): Payer: Self-pay

## 2019-03-18 ENCOUNTER — Emergency Department (HOSPITAL_COMMUNITY): Payer: Medicare (Managed Care)

## 2019-03-18 DIAGNOSIS — N10 Acute tubulo-interstitial nephritis: Secondary | ICD-10-CM | POA: Diagnosis present

## 2019-03-18 DIAGNOSIS — I739 Peripheral vascular disease, unspecified: Secondary | ICD-10-CM | POA: Diagnosis present

## 2019-03-18 DIAGNOSIS — Z781 Physical restraint status: Secondary | ICD-10-CM

## 2019-03-18 DIAGNOSIS — A4159 Other Gram-negative sepsis: Principal | ICD-10-CM | POA: Diagnosis present

## 2019-03-18 DIAGNOSIS — E860 Dehydration: Secondary | ICD-10-CM | POA: Diagnosis present

## 2019-03-18 DIAGNOSIS — R338 Other retention of urine: Secondary | ICD-10-CM | POA: Diagnosis present

## 2019-03-18 DIAGNOSIS — Z923 Personal history of irradiation: Secondary | ICD-10-CM

## 2019-03-18 DIAGNOSIS — Z9002 Acquired absence of larynx: Secondary | ICD-10-CM

## 2019-03-18 DIAGNOSIS — Z8521 Personal history of malignant neoplasm of larynx: Secondary | ICD-10-CM

## 2019-03-18 DIAGNOSIS — Z9849 Cataract extraction status, unspecified eye: Secondary | ICD-10-CM

## 2019-03-18 DIAGNOSIS — I130 Hypertensive heart and chronic kidney disease with heart failure and stage 1 through stage 4 chronic kidney disease, or unspecified chronic kidney disease: Secondary | ICD-10-CM | POA: Diagnosis present

## 2019-03-18 DIAGNOSIS — R451 Restlessness and agitation: Secondary | ICD-10-CM | POA: Diagnosis present

## 2019-03-18 DIAGNOSIS — R509 Fever, unspecified: Secondary | ICD-10-CM

## 2019-03-18 DIAGNOSIS — Z79890 Hormone replacement therapy (postmenopausal): Secondary | ICD-10-CM

## 2019-03-18 DIAGNOSIS — Z8249 Family history of ischemic heart disease and other diseases of the circulatory system: Secondary | ICD-10-CM

## 2019-03-18 DIAGNOSIS — R4182 Altered mental status, unspecified: Secondary | ICD-10-CM

## 2019-03-18 DIAGNOSIS — Z930 Tracheostomy status: Secondary | ICD-10-CM

## 2019-03-18 DIAGNOSIS — Z8589 Personal history of malignant neoplasm of other organs and systems: Secondary | ICD-10-CM

## 2019-03-18 DIAGNOSIS — I4891 Unspecified atrial fibrillation: Secondary | ICD-10-CM | POA: Diagnosis present

## 2019-03-18 DIAGNOSIS — E785 Hyperlipidemia, unspecified: Secondary | ICD-10-CM | POA: Diagnosis present

## 2019-03-18 DIAGNOSIS — I252 Old myocardial infarction: Secondary | ICD-10-CM

## 2019-03-18 DIAGNOSIS — I509 Heart failure, unspecified: Secondary | ICD-10-CM | POA: Diagnosis present

## 2019-03-18 DIAGNOSIS — F0390 Unspecified dementia without behavioral disturbance: Secondary | ICD-10-CM | POA: Diagnosis present

## 2019-03-18 DIAGNOSIS — J449 Chronic obstructive pulmonary disease, unspecified: Secondary | ICD-10-CM | POA: Diagnosis present

## 2019-03-18 DIAGNOSIS — Z79899 Other long term (current) drug therapy: Secondary | ICD-10-CM

## 2019-03-18 DIAGNOSIS — N401 Enlarged prostate with lower urinary tract symptoms: Secondary | ICD-10-CM | POA: Diagnosis present

## 2019-03-18 DIAGNOSIS — Z1619 Resistance to other specified beta lactam antibiotics: Secondary | ICD-10-CM | POA: Diagnosis present

## 2019-03-18 DIAGNOSIS — Z7982 Long term (current) use of aspirin: Secondary | ICD-10-CM

## 2019-03-18 DIAGNOSIS — A419 Sepsis, unspecified organism: Secondary | ICD-10-CM | POA: Diagnosis present

## 2019-03-18 DIAGNOSIS — Z87891 Personal history of nicotine dependence: Secondary | ICD-10-CM

## 2019-03-18 DIAGNOSIS — N179 Acute kidney failure, unspecified: Secondary | ICD-10-CM | POA: Diagnosis present

## 2019-03-18 DIAGNOSIS — Z8546 Personal history of malignant neoplasm of prostate: Secondary | ICD-10-CM

## 2019-03-18 DIAGNOSIS — E039 Hypothyroidism, unspecified: Secondary | ICD-10-CM | POA: Diagnosis present

## 2019-03-18 DIAGNOSIS — N182 Chronic kidney disease, stage 2 (mild): Secondary | ICD-10-CM | POA: Diagnosis present

## 2019-03-18 DIAGNOSIS — Z85118 Personal history of other malignant neoplasm of bronchus and lung: Secondary | ICD-10-CM

## 2019-03-18 LAB — COMPREHENSIVE METABOLIC PANEL
ALT: 22 U/L (ref 0–44)
AST: 29 U/L (ref 15–41)
Albumin: 3.5 g/dL (ref 3.5–5.0)
Alkaline Phosphatase: 88 U/L (ref 38–126)
Anion gap: 13 (ref 5–15)
BUN: 39 mg/dL — ABNORMAL HIGH (ref 8–23)
CALCIUM: 10 mg/dL (ref 8.9–10.3)
CO2: 21 mmol/L — AB (ref 22–32)
Chloride: 105 mmol/L (ref 98–111)
Creatinine, Ser: 1.9 mg/dL — ABNORMAL HIGH (ref 0.61–1.24)
GFR calc Af Amer: 41 mL/min — ABNORMAL LOW (ref >60)
GFR calc non Af Amer: 35 mL/min — ABNORMAL LOW (ref >60)
Glucose, Bld: 119 mg/dL — ABNORMAL HIGH (ref 70–99)
Potassium: 4.2 mmol/L (ref 3.5–5.1)
Sodium: 139 mmol/L (ref 135–145)
Total Bilirubin: 1.4 mg/dL — ABNORMAL HIGH (ref 0.3–1.2)
Total Protein: 7.3 g/dL (ref 6.5–8.1)

## 2019-03-18 LAB — URINALYSIS, ROUTINE W REFLEX MICROSCOPIC
Bilirubin Urine: NEGATIVE
GLUCOSE, UA: NEGATIVE mg/dL
Ketones, ur: NEGATIVE mg/dL
NITRITE: NEGATIVE
Protein, ur: 100 mg/dL — AB
Specific Gravity, Urine: 1.016 (ref 1.005–1.030)
pH: 8 (ref 5.0–8.0)

## 2019-03-18 LAB — CBC WITH DIFFERENTIAL/PLATELET
Abs Immature Granulocytes: 0 10*3/uL (ref 0.00–0.07)
Basophils Absolute: 0 10*3/uL (ref 0.0–0.1)
Basophils Relative: 0 %
EOS ABS: 0 10*3/uL (ref 0.0–0.5)
Eosinophils Relative: 0 %
HCT: 41.5 % (ref 39.0–52.0)
Hemoglobin: 13.5 g/dL (ref 13.0–17.0)
Lymphocytes Relative: 4 %
Lymphs Abs: 1.3 10*3/uL (ref 0.7–4.0)
MCH: 30.4 pg (ref 26.0–34.0)
MCHC: 32.5 g/dL (ref 30.0–36.0)
MCV: 93.5 fL (ref 80.0–100.0)
Monocytes Absolute: 1 10*3/uL (ref 0.1–1.0)
Monocytes Relative: 3 %
Neutro Abs: 29.5 10*3/uL — ABNORMAL HIGH (ref 1.7–7.7)
Neutrophils Relative %: 93 %
Platelets: 234 10*3/uL (ref 150–400)
RBC: 4.44 MIL/uL (ref 4.22–5.81)
RDW: 12.7 % (ref 11.5–15.5)
WBC: 31.7 10*3/uL — ABNORMAL HIGH (ref 4.0–10.5)
nRBC: 0 % (ref 0.0–0.2)
nRBC: 0 /100 WBC

## 2019-03-18 LAB — LACTIC ACID, PLASMA: Lactic Acid, Venous: 1.8 mmol/L (ref 0.5–1.9)

## 2019-03-18 MED ORDER — METRONIDAZOLE IN NACL 5-0.79 MG/ML-% IV SOLN
500.0000 mg | Freq: Once | INTRAVENOUS | Status: AC
  Administered 2019-03-18: 500 mg via INTRAVENOUS
  Filled 2019-03-18: qty 100

## 2019-03-18 MED ORDER — VANCOMYCIN HCL IN DEXTROSE 1-5 GM/200ML-% IV SOLN: 1000.0000 mg | Freq: Once | INTRAVENOUS | Status: DC

## 2019-03-18 MED ORDER — VANCOMYCIN HCL 10 G IV SOLR
1250.0000 mg | Freq: Once | INTRAVENOUS | Status: AC
  Administered 2019-03-18: 1250 mg via INTRAVENOUS
  Filled 2019-03-18: qty 1250

## 2019-03-18 MED ORDER — SODIUM CHLORIDE 0.9 % IV SOLN
2.0000 g | Freq: Once | INTRAVENOUS | Status: AC
  Administered 2019-03-18: 2 g via INTRAVENOUS
  Filled 2019-03-18: qty 2

## 2019-03-18 MED ORDER — LACTATED RINGERS IV BOLUS
1000.0000 mL | Freq: Once | INTRAVENOUS | Status: AC
  Administered 2019-03-18: 1000 mL via INTRAVENOUS

## 2019-03-18 MED ORDER — SODIUM CHLORIDE 0.9 % IV SOLN: 1.0000 g | Freq: Every day | INTRAVENOUS | Status: DC

## 2019-03-18 MED ORDER — VANCOMYCIN VARIABLE DOSE PER UNSTABLE RENAL FUNCTION (PHARMACIST DOSING): Status: DC

## 2019-03-18 NOTE — ED Notes (Signed)
[place] placed

## 2019-03-18 NOTE — ED Triage Notes (Signed)
Pt from home via [facility], pt wife reports fever of 101.5 at home. Pt has no other complaints. Pt has no cough and is at baseline mentally. Hx dementia, CA, and COPD. Pt did not receive any tylenol at home per EMS.

## 2019-03-18 NOTE — ED Provider Notes (Signed)
[facility] EMERGENCY DEPARTMENT Provider Note   CSN: [id number] Arrival date & time: 03/18/19  2033    History   Chief Complaint Chief Complaint  Patient presents with  . Fever    HPI Bryan Arellano is a 68 y.o. male.     68 year old male with extensive past medical history including dementia, lung cancer, CHF, atrial fibrillation, hypertension who presents with fever.  Wife states that this afternoon he began developing fevers up to 101.5 at home.  He has had a decreased appetite over the past 24 hours.  She has noticed that his urine is foul-smelling but his urine has been flowing normally and she has been emptying his Foley bag.  He has had a mild cough. No vomiting or diarrhea.  LEVEL 5 CAVEAT DUE TO DEMENTIA  The history is provided by the spouse.  Fever    Past Medical History:  Diagnosis Date  . Acute MI ([place]) 09/25/2002   hx/notes 05/15/2011  . Atrial fibrillation ([place])    hx/notes 05/15/2011  . Carotid artery occlusion   . CHF (congestive heart failure) ([place]) 09/25/2002   hx/notes 05/15/2011  . Chronic renal failure    hx/notes 05/15/2011  . COPD (chronic obstructive pulmonary disease) ([place])    hx/notes 05/15/2011  . GERD (gastroesophageal reflux disease)    hx/notes 05/15/2011  . History of radiation therapy 1999  . Hypertension    hx/notes 05/15/2011  . Hypothyroidism    hx/notes 05/15/2011  . Internal carotid artery stenosis, right    Archie Endo 03/25/2017  . Iron deficiency anemia    hx/notes 05/15/2011  . Lung cancer ([place])    hx/notes 05/15/2011  . Pain around PEG tube site   . Prostate cancer ([place])    hx/notes 05/15/2011  . PVD (peripheral vascular disease) ([place])    hx/notes 05/15/2011  . 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 AKI (acute kidney injury) ([place])   . Bladder outlet obstruction   . Hydronephrosis, bilateral   .  Urinary retention 03/04/2019  . Tracheostomy present ([place])   . History of head and neck cancer   . Other specified hypothyroidism   . 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 Laryngitis 03/26/2017  . Dysphagia 03/26/2017  . Odynophagia 03/26/2017  . Pharyngitis 03/25/2017  . Carotid artery stenosis, asymptomatic, bilateral 01/02/2017  . Moderate dementia with behavioral disturbance ([place]) 03/05/2015  . Cancer of soft palate ([place]) 03/05/2015  . Occlusion and stenosis of carotid artery without mention of cerebral infarction 02/25/2013  . 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 DYSPNEA 08/29/2008  . RADIATION THERAPY, HX OF 08/29/2008    Past Surgical History:  Procedure Laterality Date  . AORTO-FEMORAL BYPASS GRAFT  1981   hx/notes 05/15/2011  . CATARACT EXTRACTION     hx/notes 05/15/2011  . DIRECT LARYNGOSCOPY N/A 03/27/2017   Procedure: DIRECT LARYNGOSCOPY;  Surgeon: Melissa Montane, MD;  Location: [facility];  Service: ENT;  Laterality: N/A;  . ESOPHAGOSCOPY N/A 03/27/2017   Procedure: ESOPHAGOSCOPY;  Surgeon: Melissa Montane, MD;  Location: [facility];  Service: ENT;  Laterality: N/A;  . GASTROSTOMY TUBE PLACEMENT  2013   TUBE LATER REMOVED in 2013  . PSEUDOANEURYSM REPAIR  2002   hx/notes 05/15/2011  . TRANSURETHRAL RESECTION OF BLADDER TUMOR WITH GYRUS (TURBT-GYRUS)  hx/notes 05/15/2011        Home Medications    Prior to Admission medications   Medication Sig Start Date End Date Taking? Authorizing Provider  aspirin 81 MG tablet Take 81 mg by mouth daily.   Yes [provider]  donepezil (ARICEPT) 10 MG tablet TAKE 1 TABLET BY MOUTH ONCE DAILY(MIDDAY) Patient taking differently: Take 10 mg by mouth daily.  03/30/18  Yes Cameron Sprang, MD  levothyroxine (SYNTHROID, LEVOTHROID) 50 MCG tablet Take 50 mcg by mouth daily.  01/09/18  Yes [provider]  Melatonin ([employer] MELATONIN) 5 MG TABS Take 5 mg by mouth at  bedtime.   Yes [provider]  mirtazapine (REMERON) 7.5 MG tablet Take 7.5 mg by mouth at bedtime.   Yes [provider]  ROCKLATAN 0.02-0.005 % SOLN Place 1 drop into both eyes at bedtime. 12/01/18  Yes [provider]  simvastatin (ZOCOR) 10 MG tablet Take 1 tablet (10 mg total) by mouth every evening. 09/29/14  Yes Rhyne, Hulen Shouts, PA-C  tamsulosin (FLOMAX) 0.4 MG CAPS capsule Take 1 capsule (0.4 mg total) by mouth daily. 03/09/19  Yes Dorrell, Andree Elk, MD  acetaminophen (TYLENOL) 325 MG tablet Take 2 tablets (650 mg total) by mouth every 6 (six) hours as needed for mild pain (or Fever >/= 101). Patient not taking: Reported on 03/24/2018 03/28/17   Robbie Lis, MD  QUEtiapine (SEROQUEL) 50 MG tablet Take 1 tablet (50 mg total) by mouth daily. Patient not taking: Reported on 03/19/2019 03/09/19   Dorrell, Andree Elk, MD  QUEtiapine (SEROQUEL) 50 MG tablet Take 1 tablet (50 mg total) by mouth daily. Patient not taking: Reported on 03/19/2019 03/08/19   Dorrell, Andree Elk, MD  tamsulosin (FLOMAX) 0.4 MG CAPS capsule Take 1 capsule (0.4 mg total) by mouth daily. Patient not taking: Reported on 03/18/2019 03/08/19   Dorrell, Andree Elk, MD    Family History Family History  Problem Relation Age of Onset  . Heart disease Sister        before age 74  . Clotting disorder Sister   . Cancer Brother   . Heart disease Brother   . Heart attack Brother     Social History Social History   Tobacco Use  . Smoking status: Former Smoker    Types: Cigarettes    Last attempt to quit: 04/18/1998    Years since quitting: 20.9  . Smokeless tobacco: Never Used  Substance Use Topics  . Alcohol use: No    Alcohol/week: 0.0 standard drinks    Comment: pt's wife states that he quit drinking in 1997, pt use to drink about 80 beers per week and 900 shots of liquor per week  . Drug use: No     Allergies   Patient has no known allergies.   Review of Systems Review of Systems   Unable to perform ROS: Dementia  Constitutional: Positive for fever.     Physical Exam Updated Vital Signs BP 118/90   Pulse (!) 111   Temp (!) 101.4 F (38.6 C) (Rectal)   Resp (!) 31   Ht 5\' 4"  (1.626 m)   Wt 56.2 kg Comment: per wife  SpO2 97%   BMI 21.28 kg/m   Physical Exam Vitals signs and nursing note reviewed.  Constitutional:      General: He is not in acute distress.    Appearance: He is well-developed.     Comments: Frail, cachectic elderly man resting  HENT:  Head: Normocephalic and atraumatic.     Mouth/Throat:     Mouth: Mucous membranes are dry.  Eyes:     Conjunctiva/sclera: Conjunctivae normal.  Neck:     Musculoskeletal: Neck supple.  Cardiovascular:     Rate and Rhythm: Regular rhythm. Tachycardia present.     Heart sounds: Normal heart sounds. No murmur.  Pulmonary:     Effort: Pulmonary effort is normal.     Breath sounds: Normal breath sounds.  Abdominal:     General: Bowel sounds are normal. There is no distension.     Palpations: Abdomen is soft.     Tenderness: There is no abdominal tenderness.  Genitourinary:    Comments: Foley in place with cloudy urine Musculoskeletal:     Right lower leg: No edema.     Left lower leg: No edema.  Skin:    General: Skin is warm and dry.  Neurological:     Mental Status: He is alert.     Comments: disoriented  Psychiatric:     Comments: Occasionally combative      ED Treatments / Results  Labs (all labs ordered are listed, but only abnormal results are displayed) Labs Reviewed  COMPREHENSIVE METABOLIC PANEL - Abnormal; Notable for the following components:      Result Value   CO2 21 (*)    Glucose, Bld 119 (*)    BUN 39 (*)    Creatinine, Ser 1.90 (*)    Total Bilirubin 1.4 (*)    GFR calc non Af Amer 35 (*)    GFR calc Af Amer 41 (*)    All other components within normal limits  CBC WITH DIFFERENTIAL/PLATELET - Abnormal; Notable for the following components:   WBC 31.7 (*)     Neutro Abs 29.5 (*)    All other components within normal limits  CULTURE, BLOOD (ROUTINE X 2)  CULTURE, BLOOD (ROUTINE X 2)  URINE CULTURE  LACTIC ACID, PLASMA  LACTIC ACID, PLASMA  URINALYSIS, ROUTINE W REFLEX MICROSCOPIC    EKG EKG Interpretation  Date/Time:  Thursday March 18 2019 21:00:20 EDT Ventricular Rate:  115 PR Interval:    QRS Duration: 88 QT Interval:  326 QTC Calculation: 451 R Axis:   -47 Text Interpretation:  Sinus tachycardia LAD, consider left anterior fascicular block Borderline low voltage, extremity leads Nonspecific T abnormalities, anterior leads T wave inversion V3 new from previous but otherwise similar  Confirmed by Theotis Burrow [phone]) on 03/18/2019 9:39:20 PM   Radiology Dg Chest Port 1 View  Result Date: 03/18/2019 CLINICAL DATA:  Fever and altered mental status EXAM: PORTABLE CHEST 1 VIEW COMPARISON:  12/12/2018 FINDINGS: No focal airspace disease or effusion. Stable cardiomediastinal silhouette with aortic atherosclerosis. No pneumothorax. Postsurgical changes at the neck and thoracic inlet. IMPRESSION: No active disease. Electronically Signed   By: Donavan Foil M.D.   On: 03/18/2019 21:42    Procedures .Critical Care Performed by: Sharlett Iles, MD Authorized by: Sharlett Iles, MD   Critical care provider statement:    Critical care time (minutes):  30   Critical care time was exclusive of:  Separately billable procedures and treating other patients   Critical care was necessary to treat or prevent imminent or life-threatening deterioration of the following conditions:  Sepsis   Critical care was time spent personally by me on the following activities:  Development of treatment plan with patient or surrogate, evaluation of patient's response to treatment, examination of patient, obtaining history from patient or  surrogate, ordering and performing treatments and interventions, ordering and review of laboratory studies, ordering  and review of radiographic studies, re-evaluation of patient's condition and review of old charts   (including critical care time)  Medications Ordered in ED Medications  vancomycin variable dose per unstable renal function (pharmacist dosing) (has no administration in time range)  ceFEPIme (MAXIPIME) 1 g in sodium chloride 0.9 % 100 mL IVPB (has no administration in time range)  ceFEPIme (MAXIPIME) 2 g in sodium chloride 0.9 % 100 mL IVPB (0 g Intravenous Stopped 03/18/19 2257)  metroNIDAZOLE (FLAGYL) IVPB 500 mg (500 mg Intravenous New Bag/Given 03/18/19 2349)  lactated ringers bolus 1,000 mL (1,000 mLs Intravenous New Bag/Given 03/18/19 2223)  vancomycin (VANCOCIN) 1,250 mg in sodium chloride 0.9 % 250 mL IVPB (0 mg Intravenous Stopped 03/19/19 0015)  haloperidol lactate (HALDOL) injection 2 mg (2 mg Intravenous Given 03/19/19 0010)     Initial Impression / Assessment and Plan / ED Course  I have reviewed the triage vital signs and the nursing notes.  Pertinent labs & imaging results that were available during my care of the patient were reviewed by me and considered in my medical decision making (see chart for details).        Chronically ill-appearing but nontoxic on exam.  He was febrile at 101.4, heart rate 112, normal O2 saturation, BP stable.  No abdominal tenderness.  Foley catheter was exchanged due to concern for sepsis from urinary source.  Initiated code sepsis with blood and urine cultures, IV fluid bolus, vancomycin and cefepime.  Chest x-ray clear.  Lactate normal, creatinine elevated from baseline at 1.9, WBC 31.7, UA is pending.  Discussed admission with internal medicine teaching service and patient admitted for further care.  Final Clinical Impressions(s) / ED Diagnoses   Final diagnoses:  Fever, unspecified fever cause  Altered mental status, unspecified altered mental status type    ED Discharge Orders    None       Yumna Ebers, Wenda Overland, MD 03/19/19 0110

## 2019-03-18 NOTE — Progress Notes (Signed)
Pharmacy Antibiotic Note  Bryan Arellano is a 68 y.o. male admitted on 03/18/2019 with sepsis.  Pharmacy has been consulted for vancomycin and cefepime dosing. On admission patient Tmax 101.4, RR 43, HR 115.   SCr up to 1.9 (baseline ~1), est CrCl 30 ml/min  Plan: Vancomycin 1250mg  IV x1 Further doses based on renal function Cefepime 2g IV now then 1gm q 24h Monitor Scr, CBC, and clinical progression F/u C&S, de-escalation plans, and LOT  Height: 5\' 4"  (162.6 cm) Weight: 124 lb (56.2 kg)(per wife) IBW/kg (Calculated) : 59.2  Temp (24hrs), Avg:100.9 F (38.3 C), Min:100.3 F (37.9 C), Max:101.4 F (38.6 C)  Recent Labs  Lab 03/18/19 2156  WBC 31.7*  CREATININE 1.90*  LATICACIDVEN 1.8    Estimated Creatinine Clearance: 29.2 mL/min (A) (by C-G formula based on SCr of 1.9 mg/dL (H)).    No Known Allergies  Antimicrobials this admission: Vancomycin 3/19 >>  Cefepime 3/19 >>  Flagyl 3/19 >>   Microbiology results: 3/19 BCx:   Thank you for allowing pharmacy to be a part of this patient's care.  Sherlon Handing, PharmD, BCPS Clinical pharmacist  Please check AMION for all [facility] phone numbers 03/18/2019 11:35 PM

## 2019-03-19 ENCOUNTER — Other Ambulatory Visit: Payer: Self-pay

## 2019-03-19 DIAGNOSIS — I739 Peripheral vascular disease, unspecified: Secondary | ICD-10-CM | POA: Diagnosis present

## 2019-03-19 DIAGNOSIS — Z1619 Resistance to other specified beta lactam antibiotics: Secondary | ICD-10-CM | POA: Diagnosis present

## 2019-03-19 DIAGNOSIS — A4159 Other Gram-negative sepsis: Secondary | ICD-10-CM | POA: Diagnosis present

## 2019-03-19 DIAGNOSIS — R000 Tachycardia, unspecified: Secondary | ICD-10-CM

## 2019-03-19 DIAGNOSIS — Z85818 Personal history of malignant neoplasm of other sites of lip, oral cavity, and pharynx: Secondary | ICD-10-CM

## 2019-03-19 DIAGNOSIS — E860 Dehydration: Secondary | ICD-10-CM

## 2019-03-19 DIAGNOSIS — E785 Hyperlipidemia, unspecified: Secondary | ICD-10-CM

## 2019-03-19 DIAGNOSIS — I509 Heart failure, unspecified: Secondary | ICD-10-CM | POA: Diagnosis present

## 2019-03-19 DIAGNOSIS — N10 Acute tubulo-interstitial nephritis: Secondary | ICD-10-CM | POA: Diagnosis present

## 2019-03-19 DIAGNOSIS — D72829 Elevated white blood cell count, unspecified: Secondary | ICD-10-CM

## 2019-03-19 DIAGNOSIS — N390 Urinary tract infection, site not specified: Secondary | ICD-10-CM | POA: Diagnosis not present

## 2019-03-19 DIAGNOSIS — Z9849 Cataract extraction status, unspecified eye: Secondary | ICD-10-CM | POA: Diagnosis not present

## 2019-03-19 DIAGNOSIS — R338 Other retention of urine: Secondary | ICD-10-CM | POA: Diagnosis present

## 2019-03-19 DIAGNOSIS — E039 Hypothyroidism, unspecified: Secondary | ICD-10-CM | POA: Diagnosis present

## 2019-03-19 DIAGNOSIS — Z960 Presence of urogenital implants: Secondary | ICD-10-CM

## 2019-03-19 DIAGNOSIS — Z79890 Hormone replacement therapy (postmenopausal): Secondary | ICD-10-CM

## 2019-03-19 DIAGNOSIS — N401 Enlarged prostate with lower urinary tract symptoms: Secondary | ICD-10-CM

## 2019-03-19 DIAGNOSIS — Z930 Tracheostomy status: Secondary | ICD-10-CM

## 2019-03-19 DIAGNOSIS — Z79899 Other long term (current) drug therapy: Secondary | ICD-10-CM

## 2019-03-19 DIAGNOSIS — A419 Sepsis, unspecified organism: Secondary | ICD-10-CM | POA: Diagnosis present

## 2019-03-19 DIAGNOSIS — I130 Hypertensive heart and chronic kidney disease with heart failure and stage 1 through stage 4 chronic kidney disease, or unspecified chronic kidney disease: Secondary | ICD-10-CM | POA: Diagnosis present

## 2019-03-19 DIAGNOSIS — I4891 Unspecified atrial fibrillation: Secondary | ICD-10-CM | POA: Diagnosis present

## 2019-03-19 DIAGNOSIS — F0390 Unspecified dementia without behavioral disturbance: Secondary | ICD-10-CM

## 2019-03-19 DIAGNOSIS — Z9002 Acquired absence of larynx: Secondary | ICD-10-CM

## 2019-03-19 DIAGNOSIS — N182 Chronic kidney disease, stage 2 (mild): Secondary | ICD-10-CM | POA: Diagnosis present

## 2019-03-19 DIAGNOSIS — N179 Acute kidney failure, unspecified: Secondary | ICD-10-CM

## 2019-03-19 DIAGNOSIS — R509 Fever, unspecified: Secondary | ICD-10-CM | POA: Diagnosis present

## 2019-03-19 DIAGNOSIS — B964 Proteus (mirabilis) (morganii) as the cause of diseases classified elsewhere: Secondary | ICD-10-CM | POA: Diagnosis not present

## 2019-03-19 DIAGNOSIS — Z8521 Personal history of malignant neoplasm of larynx: Secondary | ICD-10-CM

## 2019-03-19 DIAGNOSIS — Z781 Physical restraint status: Secondary | ICD-10-CM | POA: Diagnosis not present

## 2019-03-19 DIAGNOSIS — J449 Chronic obstructive pulmonary disease, unspecified: Secondary | ICD-10-CM | POA: Diagnosis present

## 2019-03-19 DIAGNOSIS — Z923 Personal history of irradiation: Secondary | ICD-10-CM

## 2019-03-19 DIAGNOSIS — Z87891 Personal history of nicotine dependence: Secondary | ICD-10-CM

## 2019-03-19 DIAGNOSIS — Z7982 Long term (current) use of aspirin: Secondary | ICD-10-CM | POA: Diagnosis not present

## 2019-03-19 DIAGNOSIS — I252 Old myocardial infarction: Secondary | ICD-10-CM | POA: Diagnosis not present

## 2019-03-19 DIAGNOSIS — R451 Restlessness and agitation: Secondary | ICD-10-CM | POA: Diagnosis present

## 2019-03-19 DIAGNOSIS — I6521 Occlusion and stenosis of right carotid artery: Secondary | ICD-10-CM

## 2019-03-19 LAB — COMPREHENSIVE METABOLIC PANEL
ALT: 24 U/L (ref 0–44)
AST: 31 U/L (ref 15–41)
Albumin: 3.3 g/dL — ABNORMAL LOW (ref 3.5–5.0)
Alkaline Phosphatase: 77 U/L (ref 38–126)
Anion gap: 11 (ref 5–15)
BILIRUBIN TOTAL: 1.6 mg/dL — AB (ref 0.3–1.2)
BUN: 33 mg/dL — ABNORMAL HIGH (ref 8–23)
CO2: 24 mmol/L (ref 22–32)
Calcium: 9.5 mg/dL (ref 8.9–10.3)
Chloride: 108 mmol/L (ref 98–111)
Creatinine, Ser: 1.78 mg/dL — ABNORMAL HIGH (ref 0.61–1.24)
GFR calc Af Amer: 44 mL/min — ABNORMAL LOW (ref >60)
GFR calc non Af Amer: 38 mL/min — ABNORMAL LOW (ref >60)
Glucose, Bld: 112 mg/dL — ABNORMAL HIGH (ref 70–99)
Potassium: 4.2 mmol/L (ref 3.5–5.1)
Sodium: 143 mmol/L (ref 135–145)
TOTAL PROTEIN: 7.1 g/dL (ref 6.5–8.1)

## 2019-03-19 LAB — BLOOD CULTURE ID PANEL (REFLEXED)
Acinetobacter baumannii: NOT DETECTED
CANDIDA ALBICANS: NOT DETECTED
Candida glabrata: NOT DETECTED
Candida krusei: NOT DETECTED
Candida parapsilosis: NOT DETECTED
Candida tropicalis: NOT DETECTED
Carbapenem resistance: NOT DETECTED
ENTEROBACTER CLOACAE COMPLEX: NOT DETECTED
ENTEROBACTERIACEAE SPECIES: DETECTED — AB
Enterococcus species: NOT DETECTED
Escherichia coli: NOT DETECTED
Haemophilus influenzae: NOT DETECTED
Klebsiella oxytoca: NOT DETECTED
Klebsiella pneumoniae: NOT DETECTED
Listeria monocytogenes: NOT DETECTED
Methicillin resistance: NOT DETECTED
Neisseria meningitidis: NOT DETECTED
Proteus species: DETECTED — AB
Pseudomonas aeruginosa: NOT DETECTED
Serratia marcescens: NOT DETECTED
Staphylococcus aureus (BCID): NOT DETECTED
Staphylococcus species: NOT DETECTED
Streptococcus agalactiae: NOT DETECTED
Streptococcus pneumoniae: NOT DETECTED
Streptococcus pyogenes: NOT DETECTED
Streptococcus species: NOT DETECTED
VANCOMYCIN RESISTANCE: NOT DETECTED

## 2019-03-19 LAB — CBC
HCT: 43.3 % (ref 39.0–52.0)
Hemoglobin: 14.2 g/dL (ref 13.0–17.0)
MCH: 30.6 pg (ref 26.0–34.0)
MCHC: 32.8 g/dL (ref 30.0–36.0)
MCV: 93.3 fL (ref 80.0–100.0)
Platelets: 215 10*3/uL (ref 150–400)
RBC: 4.64 MIL/uL (ref 4.22–5.81)
RDW: 12.6 % (ref 11.5–15.5)
WBC: 30.5 10*3/uL — ABNORMAL HIGH (ref 4.0–10.5)
nRBC: 0 % (ref 0.0–0.2)

## 2019-03-19 LAB — LACTIC ACID, PLASMA: Lactic Acid, Venous: 1.9 mmol/L (ref 0.5–1.9)

## 2019-03-19 MED ORDER — SODIUM CHLORIDE 0.9% FLUSH
3.0000 mL | Freq: Two times a day (BID) | INTRAVENOUS | Status: DC
  Administered 2019-03-19 – 2019-03-22 (×4): 3 mL via INTRAVENOUS

## 2019-03-19 MED ORDER — ASPIRIN 81 MG PO CHEW
81.0000 mg | CHEWABLE_TABLET | Freq: Every day | ORAL | Status: DC
  Administered 2019-03-19 – 2019-03-23 (×5): 81 mg via ORAL
  Filled 2019-03-19 (×5): qty 1

## 2019-03-19 MED ORDER — LACTATED RINGERS IV SOLN
INTRAVENOUS | Status: DC
  Administered 2019-03-19 – 2019-03-22 (×7): via INTRAVENOUS

## 2019-03-19 MED ORDER — LEVOTHYROXINE SODIUM 50 MCG PO TABS
50.0000 ug | ORAL_TABLET | Freq: Every day | ORAL | Status: DC
  Administered 2019-03-19 – 2019-03-23 (×5): 50 ug via ORAL
  Filled 2019-03-19 (×5): qty 1

## 2019-03-19 MED ORDER — MELATONIN 3 MG PO TABS
4.5000 mg | ORAL_TABLET | Freq: Every day | ORAL | Status: DC
  Administered 2019-03-19 – 2019-03-22 (×4): 4.5 mg via ORAL
  Filled 2019-03-19 (×5): qty 1.5

## 2019-03-19 MED ORDER — NETARSUDIL-LATANOPROST 0.02-0.005 % OP SOLN
1.0000 [drp] | Freq: Every day | OPHTHALMIC | Status: DC
  Administered 2019-03-21 – 2019-03-22 (×2): 1 [drp] via OPHTHALMIC

## 2019-03-19 MED ORDER — DONEPEZIL HCL 10 MG PO TABS
10.0000 mg | ORAL_TABLET | Freq: Every day | ORAL | Status: DC
  Administered 2019-03-19 – 2019-03-23 (×5): 10 mg via ORAL
  Filled 2019-03-19 (×5): qty 1

## 2019-03-19 MED ORDER — SIMVASTATIN 20 MG PO TABS
10.0000 mg | ORAL_TABLET | Freq: Every evening | ORAL | Status: DC
  Administered 2019-03-19 – 2019-03-22 (×4): 10 mg via ORAL
  Filled 2019-03-19 (×4): qty 1

## 2019-03-19 MED ORDER — ACETAMINOPHEN 650 MG RE SUPP
650.0000 mg | Freq: Four times a day (QID) | RECTAL | Status: DC | PRN
  Administered 2019-03-19: 650 mg via RECTAL
  Filled 2019-03-19: qty 1

## 2019-03-19 MED ORDER — TAMSULOSIN HCL 0.4 MG PO CAPS
0.4000 mg | ORAL_CAPSULE | Freq: Every day | ORAL | Status: DC
  Administered 2019-03-19 – 2019-03-23 (×5): 0.4 mg via ORAL
  Filled 2019-03-19 (×5): qty 1

## 2019-03-19 MED ORDER — ACETAMINOPHEN 325 MG PO TABS
650.0000 mg | ORAL_TABLET | Freq: Four times a day (QID) | ORAL | Status: DC | PRN
  Administered 2019-03-19 – 2019-03-22 (×3): 650 mg via ORAL
  Filled 2019-03-19 (×4): qty 2

## 2019-03-19 MED ORDER — HALOPERIDOL LACTATE 5 MG/ML IJ SOLN
2.0000 mg | Freq: Once | INTRAMUSCULAR | Status: AC
  Administered 2019-03-19: 2 mg via INTRAVENOUS
  Filled 2019-03-19: qty 1

## 2019-03-19 MED ORDER — MIRTAZAPINE 15 MG PO TABS
7.5000 mg | ORAL_TABLET | Freq: Every day | ORAL | Status: DC
  Administered 2019-03-19 – 2019-03-22 (×4): 7.5 mg via ORAL
  Filled 2019-03-19 (×6): qty 1

## 2019-03-19 MED ORDER — SODIUM CHLORIDE 0.9 % IV SOLN
2.0000 g | INTRAVENOUS | Status: DC
  Administered 2019-03-19: 2 g via INTRAVENOUS
  Filled 2019-03-19: qty 20

## 2019-03-19 MED ORDER — HEPARIN SODIUM (PORCINE) 5000 UNIT/ML IJ SOLN
5000.0000 [IU] | Freq: Three times a day (TID) | INTRAMUSCULAR | Status: DC
  Administered 2019-03-19 – 2019-03-23 (×13): 5000 [IU] via SUBCUTANEOUS
  Filled 2019-03-19 (×13): qty 1

## 2019-03-19 NOTE — ED Notes (Signed)
Delay in lab draw,  MD at bedside. 

## 2019-03-19 NOTE — ED Notes (Signed)
ED TO INPATIENT HANDOFF REPORT  ED Nurse Name and Phone #: [phone] Lucita Ferrara Name/Age/Gender Bryan Arellano 68 y.o. male Room/Bed: 022C/022C  Code Status   Code Status: Full Code  Home/SNF/Other Home Patient oriented to: None Is this baseline? Yes   Triage Complete: Triage complete  Chief Complaint Fever  Triage Note Pt from home via [facility], pt wife reports fever of 101.5 at home. Pt has no other complaints. Pt has no cough and is at baseline mentally. Hx dementia, CA, and COPD. Pt did not receive any tylenol at home per EMS.    Allergies No Known Allergies  Level of Care/Admitting Diagnosis ED Disposition    ED Disposition Condition [facility] Area: [place] [[id number]]  Level of Care: Telemetry Medical [104]  Diagnosis: Sepsis [facility]) [[id number]]  Admitting Physician: Aldine Contes [phone]  Attending Physician: Aldine Contes [phone]  Estimated length of stay: past midnight tomorrow  Certification:: I certify this patient will need inpatient services for at least 2 midnights  PT Class (Do Not Modify): Inpatient [101]  PT Acc Code (Do Not Modify): Private [1]       B Medical/Surgery History Past Medical History:  Diagnosis Date  . Acute MI ([place]) 09/25/2002   hx/notes 05/15/2011  . Atrial fibrillation ([place])    hx/notes 05/15/2011  . Carotid artery occlusion   . CHF (congestive heart failure) ([place]) 09/25/2002   hx/notes 05/15/2011  . Chronic renal failure    hx/notes 05/15/2011  . COPD (chronic obstructive pulmonary disease) ([place])    hx/notes 05/15/2011  . GERD (gastroesophageal reflux disease)    hx/notes 05/15/2011  . History of radiation therapy 1999  . Hypertension    hx/notes 05/15/2011  . Hypothyroidism    hx/notes 05/15/2011  . Internal carotid artery stenosis, right    Archie Endo 03/25/2017  . Iron deficiency anemia    hx/notes 05/15/2011  . Lung cancer ([place])    hx/notes 05/15/2011  . Pain around PEG tube site   .  Prostate cancer ([place])    hx/notes 05/15/2011  . PVD (peripheral vascular disease) ([place])    hx/notes 05/15/2011  . 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 AORTO-FEMORAL BYPASS GRAFT  1981   hx/notes 05/15/2011  . CATARACT EXTRACTION     hx/notes 05/15/2011  . DIRECT LARYNGOSCOPY N/A 03/27/2017   Procedure: DIRECT LARYNGOSCOPY;  Surgeon: Melissa Montane, MD;  Location: [facility];  Service: ENT;  Laterality: N/A;  . ESOPHAGOSCOPY N/A 03/27/2017   Procedure: ESOPHAGOSCOPY;  Surgeon: Melissa Montane, MD;  Location[facility] OR;  Service: ENT;  Laterality: N/A;  . GASTROSTOMY TUBE PLACEMENT  2013   TUBE LATER REMOVED in 2013  . PSEUDOANEURYSM REPAIR  2002   hx/notes 05/15/2011  . TRANSURETHRAL RESECTION OF BLADDER TUMOR WITH GYRUS (TURBT-GYRUS)     hx/notes 05/15/2011     A IV Location/Drains/Wounds Patient Lines/Drains/Airways Status   Active Line/Drains/Airways    Name:   Placement date:   Placement time:   Site:   Days:   Peripheral IV 03/18/19 Right Antecubital   03/18/19    2156    Antecubital   1   Peripheral IV 03/18/19 Left Antecubital   03/18/19    2202    Antecubital   1   Gastrostomy/Enterostomy Gastrostomy LUQ   -    -    LUQ  Urethral Catheter Woody Coude 16 Fr.   03/18/19    2310    Coude   1   Incision (Closed) 03/27/17 N/A Other (Comment)   03/27/17    1148     722          Intake/Output Last 24 hours No intake or output data in the 24 hours ending 03/19/19 0207  Labs/Imaging Results for orders placed or performed during the hospital encounter of 03/18/19 (from the past 48 hour(s))  Lactic acid, plasma     Status: None   Collection Time: 03/18/19  9:56 PM  Result Value Ref Range   Lactic Acid, Venous 1.8 0.5 - 1.9 mmol/L    Comment: Performed at [facility], [place] [address], [place], [place] [address]  Comprehensive metabolic panel     Status: Abnormal    Collection Time: 03/18/19  9:56 PM  Result Value Ref Range   Sodium 139 135 - 145 mmol/L   Potassium 4.2 3.5 - 5.1 mmol/L   Chloride 105 98 - 111 mmol/L   CO2 21 (L) 22 - 32 mmol/L   Glucose, Bld 119 (H) 70 - 99 mg/dL   BUN 39 (H) 8 - 23 mg/dL   Creatinine, Ser 1.90 (H) 0.61 - 1.24 mg/dL   Calcium 10.0 8.9 - 10.3 mg/dL   Total Protein 7.3 6.5 - 8.1 g/dL   Albumin 3.5 3.5 - 5.0 g/dL   AST 29 15 - 41 U/L   ALT 22 0 - 44 U/L   Alkaline Phosphatase 88 38 - 126 U/L   Total Bilirubin 1.4 (H) 0.3 - 1.2 mg/dL   GFR calc non Af Amer 35 (L) >60 mL/min   GFR calc Af Amer 41 (L) >60 mL/min   Anion gap 13 5 - 15    Comment: Performed at [facility] [address], [place], [place] [address]  CBC WITH DIFFERENTIAL     Status: Abnormal   Collection Time: 03/18/19  9:56 PM  Result Value Ref Range   WBC 31.7 (H) 4.0 - 10.5 K/uL   RBC 4.44 4.22 - 5.81 MIL/uL   Hemoglobin 13.5 13.0 - 17.0 g/dL   HCT 41.5 39.0 - 52.0 %   MCV 93.5 80.0 - 100.0 fL   MCH 30.4 26.0 - 34.0 pg   MCHC 32.5 30.0 - 36.0 g/dL   RDW 12.7 11.5 - 15.5 %   Platelets 234 150 - 400 K/uL   nRBC 0.0 0.0 - 0.2 %   Neutrophils Relative % 93 %   Neutro Abs 29.5 (H) 1.7 - 7.7 K/uL   Lymphocytes Relative 4 %   Lymphs Abs 1.3 0.7 - 4.0 K/uL   Monocytes Relative 3 %   Monocytes Absolute 1.0 0.1 - 1.0 K/uL   Eosinophils Relative 0 %   Eosinophils Absolute 0.0 0.0 - 0.5 K/uL   Basophils Relative 0 %   Basophils Absolute 0.0 0.0 - 0.1 K/uL   WBC Morphology See Note     Comment: Increased Bands. >20% Bands   nRBC 0 0 /100 WBC   Abs Immature Granulocytes 0.00 0.00 - 0.07 K/uL    Comment: Performed at [facility], [place] [address] [place], [place] [address]  Lactic acid, plasma     Status: None   Collection Time: 03/19/19 12:00 AM  Result Value Ref Range   Lactic Acid, Venous 1.9 0.5 - 1.9 mmol/L    Comment: Performed at [facility]  [address] [place], [place] [address]   Dg Chest Port 1 View  Result  Date: 03/18/2019 CLINICAL DATA:  Fever and altered mental status EXAM: PORTABLE CHEST 1 VIEW COMPARISON:  12/12/2018 FINDINGS: No focal airspace disease or effusion. Stable cardiomediastinal silhouette with aortic atherosclerosis. No pneumothorax. Postsurgical changes at the neck and thoracic inlet. IMPRESSION: No active disease. Electronically Signed   By: Donavan Foil M.D.   On: 03/18/2019 21:42    Pending Labs Unresulted Labs (From admission, onward)    Start     Ordered   03/19/19 0500  Comprehensive metabolic panel  Tomorrow morning,   R     03/19/19 0138   03/19/19 0500  CBC  Tomorrow morning,   R     03/19/19 0138   03/19/19 0139  HIV antibody (Routine Testing)  Once,   R     03/19/19 0138   03/19/19 0139  CBC  (heparin)  Once,   R    Comments:  Baseline for heparin therapy IF NOT ALREADY DRAWN.  Notify MD if PLT < 100 K.    03/19/19 0138   03/19/19 0139  Creatinine, serum  (heparin)  Once,   R    Comments:  Baseline for heparin therapy IF NOT ALREADY DRAWN.    03/19/19 0138   03/19/19 0100  Culture, Urine  Add-on,   R     03/19/19 0059   03/18/19 2130  Blood Culture (routine x 2)  BLOOD CULTURE X 2,   STAT     03/18/19 2129   03/18/19 2130  Urinalysis, Routine w reflex microscopic  ONCE - STAT,   STAT     03/18/19 2129          Vitals/Pain Today's Vitals   03/18/19 2345 03/19/19 0015 03/19/19 0100 03/19/19 0115  BP: 100/65 135/89 118/90 125/81  Pulse: (!) 112 (!) 120 (!) 111 (!) 107  Resp: (!) 25 (!) 34 (!) 31 15  Temp:      TempSrc:      SpO2: 98% 100% 97% 98%  Weight:      Height:        Isolation Precautions No active isolations  Medications Medications  vancomycin variable dose per unstable renal function (pharmacist dosing) (has no administration in time range)  ceFEPIme (MAXIPIME) 1 g in sodium chloride 0.9 % 100 mL IVPB (has no administration in time range)  heparin injection 5,000 Units (has no administration in time range)  sodium chloride flush  (NS) 0.9 % injection 3 mL (has no administration in time range)  acetaminophen (TYLENOL) tablet 650 mg (has no administration in time range)    Or  acetaminophen (TYLENOL) suppository 650 mg (has no administration in time range)  aspirin chewable tablet 81 mg (has no administration in time range)  simvastatin (ZOCOR) tablet 10 mg (has no administration in time range)  donepezil (ARICEPT) tablet 10 mg (has no administration in time range)  mirtazapine (REMERON) tablet 7.5 mg (has no administration in time range)  levothyroxine (SYNTHROID, LEVOTHROID) tablet 50 mcg (has no administration in time range)  tamsulosin (FLOMAX) capsule 0.4 mg (has no administration in time range)  Melatonin TABS 4.5 mg (has no administration in time range)  Netarsudil-Latanoprost 0.02-0.005 % SOLN 1 drop (has no administration in time range)  ceFEPIme (MAXIPIME) 2 g in sodium chloride 0.9 % 100 mL IVPB (0 g Intravenous Stopped 03/18/19 2257)  metroNIDAZOLE (FLAGYL) IVPB 500 mg (0 mg Intravenous Stopped 03/19/19 0049)  lactated ringers bolus 1,000 mL (  1,000 mLs Intravenous New Bag/Given 03/18/19 2223)  vancomycin (VANCOCIN) 1,250 mg in sodium chloride 0.9 % 250 mL IVPB (0 mg Intravenous Stopped 03/19/19 0015)  haloperidol lactate (HALDOL) injection 2 mg (2 mg Intravenous Given 03/19/19 0010)    Mobility non-ambulatory     Focused Assessments Neuro Assessment Handoff:  Swallow screen pass? N/A         Neuro Assessment: Exceptions to [facility] Neuro Checks:      Last Documented NIHSS Modified Score:   Has TPA been given? No If patient is a Neuro Trauma and patient is going to OR before floor call report to [facility] nurse: [phone] or [phone]     R Recommendations: See Admitting Provider Note  Report given to:   Additional Notes: Pt is combative at times

## 2019-03-19 NOTE — ED Notes (Signed)
Report given to [facility] RN. All questions answered

## 2019-03-19 NOTE — Progress Notes (Signed)
PHARMACY - PHYSICIAN COMMUNICATION CRITICAL VALUE ALERT - BLOOD CULTURE IDENTIFICATION (BCID)  Bryan Arellano is an 68 y.o. male who presented to [facility] on 03/18/2019   Assessment:  Blood cx with proteus  Name of physician (or Provider) Contacted: Dorrell, Andree Elk, MD  Current antibiotics: Ceftriaxone  Changes to prescribed antibiotics recommended:  Continue  No results found for this or any previous visit.  Levester Fresh, PharmD, BCPS, BCCCP Clinical Pharmacist [phone]  Please check AMION for all [facility] numbers  03/19/2019 7:38 PM

## 2019-03-19 NOTE — H&P (Addendum)
Date: 03/19/2019               Patient Name:  Bryan Arellano MRN: [id number]  DOB: January 16, 1951 Age / Sex: 68 y.o., male   PCP: Patient, No Pcp Per         Medical Service: Internal Medicine Teaching Service         Attending Physician: Dr. Aldine Contes, MD    First Contact: Dr. Evern Bio Pager: [phone]  Second Contact: Dr. 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  He was recently admitted in the hospital between 3/5 to 3/9 for abdominal distention and was found to have urinary retention likely 2/2 BPH.  A coud catheter was placed.  His wife states that since being discharged from the hospital he was doing well until 3 days ago when he had his urinary catheter removed.  Several hours later he developed urinary retention and abdominal pain.  His catheter was replaced the next day.  She then began to notice foul-smelling urine.  Yesterday at 5 PM he became less responsive, very sleepy (normally able to walk around and is somewhat alert but does have dementia at baseline).  His wife states that he "passed out" and started having some shaking of his upper extremities bilaterally.  She then took his temperature which was 101.5.  She called [facility] who instructed them to come to the ED.  She endorses decreased appetite over the last 24 hours.  She denies changes in bowel movements.  In the ED he was found to have a fever of 101.4, mild tachycardia in the 110s, normal oxygen saturation, and normal blood pressure.  Code sepsis was initiated and he was  started on vancomycin, cefepime, IV fluids.  Blood and urine cultures were obtained.  Foley catheter was exchanged due to concern for urosepsis.  He also received haloperidol for combativeness.  Labs are significant for Urinalysis: WBC and bacteria present, nitrite negative; elevated creatinine of 1.90 (baseline appears to be between 0.9-1.05), leukocytosis with WBC of 31.7, neutrophil predominance, and lactic acid of 1.8.   Meds:  Current Meds  Medication Sig  . aspirin 81 MG tablet Take 81 mg by mouth daily.  Marland Kitchen donepezil (ARICEPT) 10 MG tablet TAKE 1 TABLET BY MOUTH ONCE DAILY(MIDDAY) (Patient taking differently: Take 10 mg by mouth daily. )  . levothyroxine (SYNTHROID, LEVOTHROID) 50 MCG tablet Take 50 mcg by mouth daily.   . Melatonin ([employer] MELATONIN) 5 MG TABS Take 5 mg by mouth at bedtime.  . mirtazapine (REMERON) 7.5 MG tablet Take 7.5 mg by mouth at bedtime.  Marland Kitchen ROCKLATAN 0.02-0.005 % SOLN Place 1 drop into both eyes at bedtime.  . simvastatin (ZOCOR) 10 MG tablet Take 1 tablet (10 mg total) by mouth every evening.  . tamsulosin (FLOMAX) 0.4 MG CAPS capsule Take 1 capsule (0.4 mg total) by mouth daily.     Allergies: Allergies as of 03/18/2019  . (No Known Allergies)   Past Medical History:  Diagnosis Date  . Acute MI ([place]) 09/25/2002   hx/notes 05/15/2011  . Atrial  fibrillation ([place])    hx/notes 05/15/2011  . Carotid artery occlusion   . CHF (congestive heart failure) ([place]) 09/25/2002   hx/notes 05/15/2011  . Chronic renal failure    hx/notes 05/15/2011  . COPD (chronic obstructive pulmonary disease) ([place])    hx/notes 05/15/2011  . GERD (gastroesophageal reflux disease)    hx/notes 05/15/2011  . History of radiation therapy 1999  . Hypertension    hx/notes 05/15/2011  . Hypothyroidism    hx/notes 05/15/2011  . Internal carotid artery stenosis, right    Archie Endo 03/25/2017  . Iron deficiency anemia    hx/notes 05/15/2011  . Lung cancer ([place])    hx/notes 05/15/2011  . Pain around  PEG tube site   . Prostate cancer ([place])    hx/notes 05/15/2011  . PVD (peripheral vascular disease) ([place])    hx/notes 05/15/2011  . Squamous cell carcinoma of soft palate (HCC) 1999   head and neck; S/P radiation/notes 03/25/2017    Family History:  Family History  Problem Relation Age of Onset  . Heart disease Sister        before age 58  . Clotting disorder Sister   . Cancer Brother   . Heart disease Brother   . Heart attack Brother     Social History: He lives at home with his wife and grandchildren.  He is a former heavy smoker and quit smoking tobacco 1999.  He also has a history of alcohol use disorder but quit drinking 1997.  Wife denies illicit drug use.  Review of Systems: A complete ROS was negative except as per HPI.   Physical Exam: Blood pressure 135/89, pulse (!) 120, temperature (!) 101.4 F (38.6 C), temperature source Rectal, resp. rate (!) 34, height 5\' 4"  (1.626 m), weight 56.2 kg, SpO2 100 %. General: Frail appearing male sleeping in bed in no acute distress.   Cardiovascular: Tachycardic, normal rhythm Respiratory: Clear to auscultation bilaterally, normal work of breathing, no respiratory distress Abdominal: Soft abdomen, nontender to palpation, no masses appreciated GU: Foley catheter in place with cloudy pink urine. MSK: No lower extremity edema or tenderness to palpation.  No warmth or erythema. Skin: Warm and dry Neurologic: He opens his eyes for several seconds to verbal and tactile stimuli before falling back asleep.  He is nonverbal at baseline but does nod yes and no to brief questions.  EKG: personally reviewed my interpretation is sinus tachycardia, T wave inversion in V3.  CXR: personally reviewed my interpretation is unremarkable without signs of pleural effusion, infiltrates, or other abnormalities.  Assessment & Plan by Problem: Active Problems:   Sepsis [facility])  Bryan Arellano presents with a fever, tachycardia, and leukocytosis starting for  sepsis.  He also has foul-smelling urine and and a normal urinalysis (WBC, bacteria, nitrite negative) concerning for urinary source.  Other sources less likely: Chest x-ray unremarkable.  Abdominal exam unremarkable.  No skin breakdown.  Sepsis: Likely from urologic source.  Catheter replaced in the ED. - S/p cefepime, metronidazole, vancomycin.  Will discontinue these antibiotics. - Start ceftriaxone 2 g every 24 hours - Follow-up blood cultures - Follow-up urine cultures  Acute kidney injury: S/p 1 L LR - Maintenance LR at 75 cc/h  Dementia: - Continue donezepil and mirtazapine  BPH: -Continue tamsulosin 0.4 mg daily  Hypothyroidism: - Continue levothyroxine 50 mcg  FEN/GI: Maintenance LR fluids CODE STATUS: Full code DVT prophylaxis: Subcu heparin  Dispo: Admit patient to Inpatient with expected length of stay greater than 2 midnights.  Signed:  Carroll Sage, MD 03/19/2019, 12:48 AM

## 2019-03-19 NOTE — Progress Notes (Signed)
   Subjective: Bryan Arellano was seen this morning with wife at bedside. Wife notes he was a little restless after arriving to floor and his fever worsened. The patient shakes his head no when asked if he is in any pain. His wife reports that urology removed his catheter at follow-up appointment on Tuesday of this week. Planned to repeat PSA test on 4/14 to ensure it was not falsely elevated after foley was placed in the hospital. He was initially able to void on his own, but soon became restless and his abdomen became distended. Foley was replaced at [facility] the following day due to retention.   Discussed plan to continue IV antibiotics for urinary tract infection until culture and sensitivities return. All of wife's questions and concerns were addressed.   Objective:  Vital signs in last 24 hours: Vitals:   03/19/19 0200 03/19/19 0300 03/19/19 0443 03/19/19 0640  BP: 126/80 136/75 (!) 112/59 104/73  Pulse: (!) 106 (!) 114 (!) 115 (!) 105  Resp: 18 17 20 20   Temp:  (!) 101.8 F (38.8 C) 99.5 F (37.5 C) 99.6 F (37.6 C)  TempSrc:  Oral Oral Oral  SpO2: 98% 97% 98% 94%  Weight:      Height:       General: awake, appears agitated and persistently pulling at mittens but cooperative for exam CV: RRR; no murmurs Pulm: normal work of breathing on room air; lungs CTAB Abd: abdomen is soft, non-distended, patient grimaces to abdominal palpation GU: foley catheter in place with pink-tinged urine   Assessment/Plan:  Active Problems:   Sepsis [facility])  Bryan Arellano is a 68 year old male with 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  Sepsis - Likely from urologic source. Continues to be febrile and mildly  tachycardic. Hemodynamically stable. - Discontinued tele as patient kept pulling at the lines Plan - Ceftriaxone 2 g every 24 hours - Follow-up blood cultures - Follow-up urine culture  Acute kidney injury - Cr  Improved from 1.9 on admission to 1.78. BL is around 1. Likely prerenal in the setting of infection and dehydration. Plan - Maintenance LR at 75 cc/h - Trend BMP  Dementia - Continue donezepil and mirtazapine - Continue soft restraints - Avoid centrally acting medications if possible  BPH: Continue tamsulosin 0.4 mg daily Hypothyroidism: Continue levothyroxine 50 mcg  Dispo: Anticipated discharge in approximately 3-4 days.  Azalie Harbeck, Andree Elk, MD 03/19/2019, 6:45 AM Pager: [phone]

## 2019-03-20 LAB — BASIC METABOLIC PANEL
Anion gap: 12 (ref 5–15)
BUN: 26 mg/dL — ABNORMAL HIGH (ref 8–23)
CO2: 23 mmol/L (ref 22–32)
Calcium: 8.9 mg/dL (ref 8.9–10.3)
Chloride: 109 mmol/L (ref 98–111)
Creatinine, Ser: 1.71 mg/dL — ABNORMAL HIGH (ref 0.61–1.24)
GFR calc Af Amer: 46 mL/min — ABNORMAL LOW (ref >60)
GFR, EST NON AFRICAN AMERICAN: 40 mL/min — AB (ref >60)
Glucose, Bld: 96 mg/dL (ref 70–99)
POTASSIUM: 3.5 mmol/L (ref 3.5–5.1)
Sodium: 144 mmol/L (ref 135–145)

## 2019-03-20 LAB — CBC
HCT: 36 % — ABNORMAL LOW (ref 39.0–52.0)
Hemoglobin: 11.7 g/dL — ABNORMAL LOW (ref 13.0–17.0)
MCH: 30.5 pg (ref 26.0–34.0)
MCHC: 32.5 g/dL (ref 30.0–36.0)
MCV: 93.8 fL (ref 80.0–100.0)
Platelets: 196 10*3/uL (ref 150–400)
RBC: 3.84 MIL/uL — ABNORMAL LOW (ref 4.22–5.81)
RDW: 13 % (ref 11.5–15.5)
WBC: 16.7 10*3/uL — ABNORMAL HIGH (ref 4.0–10.5)
nRBC: 0 % (ref 0.0–0.2)

## 2019-03-20 LAB — HIV ANTIBODY (ROUTINE TESTING W REFLEX): HIV SCREEN 4TH GENERATION: NONREACTIVE

## 2019-03-20 LAB — URINE CULTURE: Culture: 50000 — AB

## 2019-03-20 MED ORDER — SODIUM CHLORIDE 0.9 % IV SOLN
2.0000 g | Freq: Two times a day (BID) | INTRAVENOUS | Status: DC
  Administered 2019-03-20 – 2019-03-23 (×7): 2 g via INTRAVENOUS
  Filled 2019-03-20 (×9): qty 2

## 2019-03-20 NOTE — Progress Notes (Addendum)
   Subjective: Overnight, Mr. Detty had some agitation and was pulling at his IV and foley, requiring placement of soft restraints. His wife reports that he otherwise seems to be improving and has not had a fever. The patient is sleeping calmly this morning. Ms. Kassim would again like to avoid SNF if possible when the patient is ready for discharge. All questions addressed.  Objective:  Vital signs in last 24 hours: Vitals:   03/19/19 1332 03/19/19 1546 03/19/19 2126 03/20/19 0537  BP: 113/74  125/70 127/78  Pulse: 97  (!) 102 (!) 115  Resp: (!) 24  15 16   Temp: 98.3 F (36.8 C) 100.3 F (37.9 C) 99.5 F (37.5 C) 99.9 F (37.7 C)  TempSrc: Oral Axillary Oral Oral  SpO2: 99%  98% 97%  Weight:      Height:       Gen: sleeping comfortably in bed, no distress CV: RRR, no murmurs Pulm: CTAB, normal work of breathing on room air Abd: soft, non-distended, non-tender Ext: no edema  Assessment/Plan:  Active Problems:   Sepsis [facility])  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  Proteus bacteremia 2/2 urosepsis - Blood cx grew Proteus. Urine culture has also grown proteus penneri, resistant to ceftriaxone. Discussed antibiotic regimen with pharmacy and ID pharmacy. Meropenem recommended as the best IV option.  - Afebrile and hemodynamically stable. White count decreased from 30 to 16.  Plan - Discontinue ceftriaxone. Transition to meropenem. - Follow-upblood cultures  Acute kidney injury - Cr improved from 1.9 on admission to 1.7 today. BL is around 1. Likely prerenal in the setting of infection and dehydration. Plan - Continue LR at 75 cc/hr - Trend  BMP  Dementia - Continue donezepil and mirtazapine - Continue soft restraints - Avoid centrally acting medications if possible  BPH: Continue tamsulosin 0.4 mg daily Hypothyroidism: Continue levothyroxine 50 mcg  Dispo: Anticipated discharge in approximately 2-3 day(s).   Neil Brickell, Andree Elk, MD 03/20/2019, 7:01 AM Pager: [phone]

## 2019-03-20 NOTE — Progress Notes (Signed)
Pharmacy Antibiotic Note  Bryan Arellano is a 68 y.o. male admitted on 03/18/2019 with foul smelling urine and less responsiveness.  Pharmacy has been consulted for Merrem dosing for Proteus bacteremia/UTI.  AKI improving slowly, afebrile, WBC down to 16.7.  Plan: Merrem 2gm IV Q12H Monitor renal fxn, clinical progress   Height: 5\' 4"  (162.6 cm) Weight: 124 lb (56.2 kg)(per wife) IBW/kg (Calculated) : 59.2  Temp (24hrs), Avg:99.2 F (37.3 C), Min:98 F (36.7 C), Max:100.3 F (37.9 C)  Recent Labs  Lab 03/18/19 2156 03/19/19 0000 03/19/19 0232 03/20/19 0307  WBC 31.7*  --  30.5* 16.7*  CREATININE 1.90*  --  1.78* 1.71*  LATICACIDVEN 1.8 1.9  --   --     Estimated Creatinine Clearance: 32.4 mL/min (A) (by C-G formula based on SCr of 1.71 mg/dL (H)).    No Known Allergies  CTX 3/20 >> 3/21 Merrem 3/21 >>  3/19 BCx - GNR (BCID Proteus) 3/19 UCx - Proteus (S Cipro, Primaxin, Septra, Zosyn)  Arthea Nobel D. Mina Marble, PharmD, BCPS, [place] 03/20/2019, 10:24 AM

## 2019-03-21 DIAGNOSIS — N10 Acute tubulo-interstitial nephritis: Secondary | ICD-10-CM | POA: Diagnosis present

## 2019-03-21 DIAGNOSIS — N400 Enlarged prostate without lower urinary tract symptoms: Secondary | ICD-10-CM

## 2019-03-21 LAB — CBC
HCT: 35.5 % — ABNORMAL LOW (ref 39.0–52.0)
Hemoglobin: 11.8 g/dL — ABNORMAL LOW (ref 13.0–17.0)
MCH: 30.4 pg (ref 26.0–34.0)
MCHC: 33.2 g/dL (ref 30.0–36.0)
MCV: 91.5 fL (ref 80.0–100.0)
Platelets: 185 10*3/uL (ref 150–400)
RBC: 3.88 MIL/uL — ABNORMAL LOW (ref 4.22–5.81)
RDW: 13.1 % (ref 11.5–15.5)
WBC: 13.3 10*3/uL — ABNORMAL HIGH (ref 4.0–10.5)
nRBC: 0 % (ref 0.0–0.2)

## 2019-03-21 LAB — BASIC METABOLIC PANEL
Anion gap: 12 (ref 5–15)
BUN: 18 mg/dL (ref 8–23)
CHLORIDE: 108 mmol/L (ref 98–111)
CO2: 23 mmol/L (ref 22–32)
Calcium: 8.6 mg/dL — ABNORMAL LOW (ref 8.9–10.3)
Creatinine, Ser: 1.52 mg/dL — ABNORMAL HIGH (ref 0.61–1.24)
GFR calc Af Amer: 53 mL/min — ABNORMAL LOW (ref >60)
GFR calc non Af Amer: 46 mL/min — ABNORMAL LOW (ref >60)
Glucose, Bld: 109 mg/dL — ABNORMAL HIGH (ref 70–99)
Potassium: 3.1 mmol/L — ABNORMAL LOW (ref 3.5–5.1)
Sodium: 143 mmol/L (ref 135–145)

## 2019-03-21 MED ORDER — POTASSIUM CHLORIDE 20 MEQ/15ML (10%) PO SOLN
40.0000 meq | Freq: Two times a day (BID) | ORAL | Status: AC
  Administered 2019-03-21 (×2): 40 meq via ORAL
  Filled 2019-03-21 (×2): qty 30

## 2019-03-21 NOTE — Progress Notes (Signed)
   Subjective:  No overnight events. The patient nods yes when asked if he is in pain and subsequently points to the tracheal valve as the source of his pain. The patient's wife stated that she placed the valve due to the concern for the COVID-19 virus thinking that perhaps this will reduce his risk of contracting the illness. She states the patient was agitated last night because of the trach valve. The patient's wife continues to decline SNF, but she is worried that she'll have trouble getting him to take any oral medications at home.   Objective:  Vital signs in last 24 hours: Vitals:   03/20/19 0537 03/20/19 1357 03/20/19 2048 03/21/19 0436  BP: 127/78 (!) 125/98 131/82 132/86  Pulse: (!) 115 82 (!) 118 96  Resp: 16 18 18 18   Temp: 99.9 F (37.7 C) 98.6 F (37 C) 97.9 F (36.6 C) 100.2 F (37.9 C)  TempSrc: Oral Axillary Oral Oral  SpO2: 97% 98% 95% 98%  Weight:      Height:       General: Alert, nods appropriately to questions, in no acute distress, afebrile, nondiaphoretic Cardio: RRR, no mrg's  Pulmonary: CTA bilaterally, no wheezing or crackles Abdomen: Bowel sounds normal, soft, nontender  MSK: BLE nontender, nonedematous  Assessment/Plan:  Active Problems:   Sepsis [facility])  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  Proteus bacteremia 2/2 urosepsis - Blood cx grew Proteus. Urine culture has also grown proteus penneri, resistant to ceftriaxone.  - Afebrile and hemodynamically stable. White count decreased from 30 to 16.  Plan - Continue meropenem (day 2) -Follow-upblood cultures  Acute kidney injury - Cr improved from 1.9 on admission to  1.5 today. BL is around 1. Likely prerenal in the setting of infection and dehydration. Plan -Continue LR at 75 cc/hr - Trend BMP  Dementia -Continue donezepil and mirtazapine - Continue soft restraints - Avoid centrally acting medications if possible  TJQ:[id number] tamsulosin 0.4 mg daily Hypothyroidism:Continue levothyroxine 50 mcg  Dispo: Anticipated discharge in approximately 2-3 days.  Bryan Arellano, Andree Elk, MD 03/21/2019, 6:53 AM Pager: [phone]

## 2019-03-21 NOTE — Plan of Care (Signed)
  Problem: Clinical Measurements: Goal: Will remain free from infection Outcome: Progressing Goal: Cardiovascular complication will be avoided Outcome: Progressing   Problem: Nutrition: Goal: Adequate nutrition will be maintained Outcome: Progressing   Problem: Elimination: Goal: Will not experience complications related to bowel motility Outcome: Progressing Goal: Will not experience complications related to urinary retention Outcome: Progressing   Problem: Pain Managment: Goal: General experience of comfort will improve Outcome: Progressing   Problem: Safety: Goal: Ability to remain free from injury will improve Outcome: Progressing   Problem: Skin Integrity: Goal: Risk for impaired skin integrity will decrease Outcome: Progressing

## 2019-03-22 DIAGNOSIS — N390 Urinary tract infection, site not specified: Secondary | ICD-10-CM

## 2019-03-22 DIAGNOSIS — A419 Sepsis, unspecified organism: Secondary | ICD-10-CM

## 2019-03-22 DIAGNOSIS — B964 Proteus (mirabilis) (morganii) as the cause of diseases classified elsewhere: Secondary | ICD-10-CM

## 2019-03-22 LAB — BASIC METABOLIC PANEL
Anion gap: 9 (ref 5–15)
BUN: 17 mg/dL (ref 8–23)
CO2: 22 mmol/L (ref 22–32)
Calcium: 8.3 mg/dL — ABNORMAL LOW (ref 8.9–10.3)
Chloride: 109 mmol/L (ref 98–111)
Creatinine, Ser: 1.5 mg/dL — ABNORMAL HIGH (ref 0.61–1.24)
GFR calc Af Amer: 54 mL/min — ABNORMAL LOW (ref >60)
GFR, EST NON AFRICAN AMERICAN: 47 mL/min — AB (ref >60)
Glucose, Bld: 91 mg/dL (ref 70–99)
POTASSIUM: 3.7 mmol/L (ref 3.5–5.1)
Sodium: 140 mmol/L (ref 135–145)

## 2019-03-22 LAB — CBC
HCT: 35.9 % — ABNORMAL LOW (ref 39.0–52.0)
Hemoglobin: 12.2 g/dL — ABNORMAL LOW (ref 13.0–17.0)
MCH: 31.3 pg (ref 26.0–34.0)
MCHC: 34 g/dL (ref 30.0–36.0)
MCV: 92.1 fL (ref 80.0–100.0)
Platelets: 172 10*3/uL (ref 150–400)
RBC: 3.9 MIL/uL — ABNORMAL LOW (ref 4.22–5.81)
RDW: 13.2 % (ref 11.5–15.5)
WBC: 12.2 10*3/uL — ABNORMAL HIGH (ref 4.0–10.5)
nRBC: 0 % (ref 0.0–0.2)

## 2019-03-22 LAB — CULTURE, BLOOD (ROUTINE X 2): Special Requests: ADEQUATE

## 2019-03-22 NOTE — Progress Notes (Signed)
   Subjective: No overnight events. Mr. Rood' wife thinks he is almost back to his baseline and has become more interactive. She notes his urine appears much more clear as well. Discussed plan to figure out best antibiotic option for him to go home with given his difficulty with swallowing pills at home. Mrs. Sculley has not been open to short-term rehab placement, especially with concern for not being able to visit. She will continue thinking about it today and is open to a discussion with social work.   Objective:  Vital signs in last 24 hours: Vitals:   03/21/19 0436 03/21/19 1702 03/21/19 2118 03/22/19 0603  BP: 132/86 127/82 118/77 128/89  Pulse: 96 (!) 57 71 94  Resp: 18 (!) 24 20 18   Temp: 100.2 F (37.9 C) 98.7 F (37.1 C) 98 F (36.7 C) 98.7 F (37.1 C)  TempSrc: Oral Oral Oral Oral  SpO2: 98% 94% 98% 95%  Weight:      Height:       General: sleeping comfortably in bed; NAD CV: RRR; no murmurs Pulm: normal work of breathing on room air; CTAB Abd: BS+; abdomen is soft, non-tender, non-distended  Ext: no edema   Assessment/Plan:  Active Problems:   Sepsis ([place])   Acute pyelonephritis  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  Proteus bacteremia 2/2 urosepsis - Blood cx and urine cx grew proteus penneri, resistant to ceftriaxone.  - Afebrile and hemodynamically stable. White count decreased from 30 to 12. - SW consulted for placement options if wife is interested in SNF. Spoke with his PCP Dr. Jimmye Norman via telephone today. She states that if the patient's wife decides to take him home, the [facility] can arrange a liquid bactrim option.  Plan -  Continue meropenem (day 3). Transition to PO when patient is back to baseline, likely tomorrow. - PT/OT  Acute kidney injury - Crimproved from 1.9 on admission to 1.5today. BL is around 1. Likely prerenal in the setting of infection and dehydration. Plan -ContinueLR at 75 cc/hr - Trend BMP  Dementia -Continue donezepil and mirtazapine - Continue soft restraints - Avoid centrally acting medications if possible  Dispo: Anticipated discharge in approximately 1-2 day(s).   Ratasha Fabre, Andree Elk, MD 03/22/2019, 6:38 AM Pager: [phone]

## 2019-03-22 NOTE — NC FL2 (Signed)
[place] LEVEL OF CARE SCREENING TOOL     IDENTIFICATION  Patient Name: Bryan Arellano Birthdate: [birth year] Sex: male Admission Date (Current Location): 03/18/2019  [facility] and [place] Number:  Herbalist and Address:  [facility], [place] [address] [place], [place] [address]      Provider Number: [id number]  Attending Physician Name and Address:  Aldine Contes, MD  Relative Name and Phone Number:  Joeangel Jeanpaul; wife; [phone]    Current Level of Care: Hospital Recommended Level of Care: [place] Prior Approval Number:    Date Approved/Denied:   PASRR Number: [id number]  Discharge Plan: SNF    Current Diagnoses: Patient Active Problem List   Diagnosis Date Noted  . Acute pyelonephritis 03/21/2019  . Sepsis ([place]) 03/19/2019  . AKI (acute kidney injury) ([place])   . Bladder outlet obstruction   . Hydronephrosis, bilateral   . Urinary retention 03/04/2019  . Tracheostomy present ([place])   . History of head and neck cancer   . Other specified hypothyroidism   . 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 Laryngitis 03/26/2017  . Dysphagia 03/26/2017  . Odynophagia 03/26/2017  . Pharyngitis 03/25/2017  . Carotid artery stenosis, asymptomatic, bilateral 01/02/2017  . Moderate dementia with behavioral disturbance ([place]) 03/05/2015  . Cancer of soft palate ([place]) 03/05/2015  . Occlusion and stenosis of carotid artery without mention of cerebral infarction 02/25/2013  . 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 DYSPNEA 08/29/2008  . RADIATION THERAPY, HX OF 08/29/2008    Orientation RESPIRATION BLADDER Height & Weight     Self  Normal(trach stoma) Continent, Indwelling catheter Weight: 124 lb (56.2 kg)(per wife) Height:  5\' 4"  (162.6 cm)  BEHAVIORAL SYMPTOMS/MOOD NEUROLOGICAL BOWEL NUTRITION STATUS      Continent Diet(soft diet; thin liquids)   AMBULATORY STATUS COMMUNICATION OF NEEDS Skin   Extensive Assist Non-Verbally Other (Comment)(trach stoma; ecchymosis on arms bilaterally)                       Personal Care Assistance Level of Assistance  Bathing, Feeding, Dressing Bathing Assistance: Maximum assistance Feeding assistance: Maximum assistance Dressing Assistance: Maximum assistance     Functional Limitations Info  Sight, Hearing, Speech Sight Info: Adequate Hearing Info: Adequate Speech Info: Impaired(nonverbal)    SPECIAL CARE FACTORS FREQUENCY  PT (By licensed PT), OT (By licensed OT)     PT Frequency: 5x week OT Frequency: 5x week            Contractures Contractures Info: Not present    Additional Factors Info  Code Status, Allergies, Psychotropic Code Status Info: Full Code Allergies Info: No Known Allergies  Psychotropic Info: donepezil (ARICEPT) tablet 10 mg daily PO; mirtazapine (REMERON) tablet 7.5 mg daily at bedtime PO         Current Medications (03/22/2019):  This is the current hospital active medication list Current Facility-Administered Medications  Medication Dose Route Frequency Provider Last Rate Last Dose  . acetaminophen (TYLENOL) tablet 650 mg  650 mg Oral Q6H PRN Neva Seat, MD   650 mg at 03/21/19 1814   Or  . acetaminophen (TYLENOL) suppository 650 mg  650 mg Rectal Q6H PRN Neva Seat, MD   650 mg at 03/19/19 0314  . aspirin chewable tablet 81 mg  81 mg Oral Daily Neva Seat, MD   81 mg at 03/22/19 1039  . donepezil (ARICEPT) tablet  10 mg  10 mg Oral Daily Neva Seat, MD   10 mg at 03/22/19 1039  . heparin injection 5,000 Units  5,000 Units Subcutaneous Q8H Neva Seat, MD   5,000 Units at 03/22/19 0600  . lactated ringers infusion   Intravenous Continuous Neva Seat, MD 75 mL/hr at 03/21/19 2307    . levothyroxine (SYNTHROID, LEVOTHROID) tablet 50 mcg  50 mcg Oral QAC breakfast Neva Seat, MD   50 mcg at 03/22/19 0600  .  Melatonin TABS 4.5 mg  4.5 mg Oral QHS Neva Seat, MD   4.5 mg at 03/21/19 2249  . meropenem (MERREM) 2 g in sodium chloride 0.9 % 100 mL IVPB  2 g Intravenous Q12H Dang, Thuy D, RPH 200 mL/hr at 03/22/19 1049 2 g at 03/22/19 1049  . mirtazapine (REMERON) tablet 7.5 mg  7.5 mg Oral QHS Neva Seat, MD   7.5 mg at 03/21/19 2249  . Netarsudil-Latanoprost 0.02-0.005 % SOLN 1 drop  1 drop Both Eyes QHS Neva Seat, MD   1 drop at 03/21/19 2251  . simvastatin (ZOCOR) tablet 10 mg  10 mg Oral QPM Neva Seat, MD   10 mg at 03/21/19 1814  . sodium chloride flush (NS) 0.9 % injection 3 mL  3 mL Intravenous Q12H Neva Seat, MD   3 mL at 03/21/19 2251  . tamsulosin (FLOMAX) capsule 0.4 mg  0.4 mg Oral Daily Neva Seat, MD   0.4 mg at 03/22/19 1039     Discharge Medications: Please see discharge summary for a list of discharge medications.  Relevant Imaging Results:  Relevant Lab Results:   Additional Information [id number] [place], [place]

## 2019-03-22 NOTE — TOC Initial Note (Signed)
Transition of Care [facility]) - Initial/Assessment Note    Patient Details  Name: Bryan Arellano MRN: [id number] Date of Birth: 1950-12-31  Transition of Care [facility]) CM/SW Contact:    Alexander Mt, [place] Phone Number: 03/22/2019, 11:21 AM  Clinical Narrative:                 CSW received consult for pt regarding SNF placement. CSW familiar with pt care needs from previous admission. Pt from home with wife, disoriented and nonverbal. CSW called pt wife and spoke with her regarding discharge needs. Introduced self, role, and reason for visit. Pt wife states that she cares for pt at home with the help of [facility]. She is extremely worried about pt being mistreated at SNF when she cannot be there to see how he is doing due to visitation policies at SNFs. CSW explained that ultimately the choice is up to her but many of the services that pt receives in the home through [facility] are also on hold at this time. Pt wife gives permission to send pt referral but does want to think about it. Spoke with pt CSW at [facility] and requested she reach out to pt wife also. Lavella Hammock is working on SNF approval at this time in case pt wife does elect for SNF placement.   Will initiate referral.   Expected Discharge Plan: Skilled Nursing Facility Barriers to Discharge: Continued Medical Work up, Ship broker   Patient Goals and CMS Choice   CMS Medicare.gov Compare Post Acute Care list provided to:: Patient Represenative (must comment) Choice offered to / list presented to : Spouse  Expected Discharge Plan and Services Expected Discharge Plan: [place] In-house Referral: NA Discharge Planning Services: NA Post Acute Care Choice: [place] Living arrangements for the past 2 months: Single Family Home Expected Discharge Date: 03/23/19                  Prior Living Arrangements/Services Living arrangements for the past 2 months: Single Family Home Lives with:: Spouse Patient  language and need for interpreter reviewed:: No Do you feel safe going back to the place where you live?: Yes      Need for Family Participation in Patient Care: Yes (Comment)(decision making and physical care) Care giver support system in place?: Yes (comment)(pt wife and [facility]) Current home services: Home OT, Home PT, Home RN([facility]) Criminal Activity/Legal Involvement Pertinent to Current Situation/Hospitalization: No - Comment as needed  Activities of Daily Living Home Assistive Devices/Equipment: Environmental consultant (specify type) ADL Screening (condition at time of admission) Patient's cognitive ability adequate to safely complete daily activities?: No Is the patient deaf or have difficulty hearing?: No Does the patient have difficulty seeing, even when wearing glasses/contacts?: No Does the patient have difficulty concentrating, remembering, or making decisions?: Yes Patient able to express need for assistance with ADLs?: No Does the patient have difficulty dressing or bathing?: Yes Independently performs ADLs?: No Communication: Independent Dressing (OT): Needs assistance Is this a change from baseline?: Pre-admission baseline Grooming: Needs assistance Is this a change from baseline?: Pre-admission baseline Feeding: Needs assistance Is this a change from baseline?: Pre-admission baseline Bathing: Needs assistance Is this a change from baseline?: Pre-admission baseline Toileting: Needs assistance Is this a change from baseline?: Pre-admission baseline In/Out Bed: Needs assistance Is this a change from baseline?: Pre-admission baseline Walks in Home: Independent with device (comment) Does the patient have difficulty walking or climbing stairs?: Yes Weakness of Legs: Both Weakness of Arms/Hands: None  Permission Sought/Granted Permission sought to share information with : Family Supports Permission granted to share information with : No(pt has dementia; confused at baseline)  Share  Information with NAME: [place] granted to share info w AGENCY: [facility]  Permission granted to share info w Relationship: wife  Permission granted to share info w Contact Information: [phone]  Emotional Assessment Appearance:: Appears stated age Attitude/Demeanor/Rapport: Unable to Assess Affect (typically observed): Unable to Assess Orientation: : Oriented to Self Alcohol / Substance Use: Not Applicable Psych Involvement: No (comment)  Admission diagnosis:  Fever, unspecified fever cause [R50.9] Altered mental status, unspecified altered mental status type [R41.82] Patient Active Problem List   Diagnosis Date Noted  . Acute pyelonephritis 03/21/2019  . Sepsis ([place]) 03/19/2019  . AKI (acute kidney injury) ([place])   . Bladder outlet obstruction   . Hydronephrosis, bilateral   . Urinary retention 03/04/2019  . Tracheostomy present ([place])   . History of head and neck cancer   . Other specified hypothyroidism   . 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 Laryngitis 03/26/2017  . Dysphagia 03/26/2017  . Odynophagia 03/26/2017  . Pharyngitis 03/25/2017  . Carotid artery stenosis, asymptomatic, bilateral 01/02/2017  . Moderate dementia with behavioral disturbance ([place]) 03/05/2015  . 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 Occlusion and stenosis of carotid artery without mention of cerebral infarction 02/25/2013  . 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 DYSPNEA 08/29/2008  . RADIATION THERAPY, HX OF 08/29/2008   PCP:  Patient, No Pcp Per Pharmacy:   [facility] [place] - [address] [place] [address] Phone: [phone] Fax: [phone]     Social Determinants of Health (SDOH) Interventions    Readmission Risk Interventions No flowsheet data found.

## 2019-03-22 NOTE — Plan of Care (Signed)
  Problem: Clinical Measurements: Goal: Ability to maintain clinical measurements within normal limits will improve Outcome: Progressing Goal: Will remain free from infection Outcome: Progressing Goal: Respiratory complications will improve Outcome: Progressing Goal: Cardiovascular complication will be avoided Outcome: Progressing   Problem: Nutrition: Goal: Adequate nutrition will be maintained Outcome: Progressing   Problem: Coping: Goal: Level of anxiety will decrease Outcome: Progressing   Problem: Elimination: Goal: Will not experience complications related to bowel motility Outcome: Progressing Goal: Will not experience complications related to urinary retention Outcome: Progressing   Problem: Pain Managment: Goal: General experience of comfort will improve Outcome: Progressing   Problem: Safety: Goal: Ability to remain free from injury will improve Outcome: Progressing   Problem: Skin Integrity: Goal: Risk for impaired skin integrity will decrease Outcome: Progressing

## 2019-03-22 NOTE — Evaluation (Signed)
Physical Therapy Evaluation Patient Details Name: Bryan Arellano MRN: [id number] DOB: 1951-03-08 Today's Date: 03/22/2019   History of Present Illness  68yo male with recent hospital admission March 5th-9th due to abdominal distension and urinary retention, permanent catheter placed and patient was DCed. He then had catheter removed and then replaced at [facility]. Then found in AMS and unconscious by wife and taken to the ED. Admitted for sepsis secondary to UTI. PMH MI, A-fib, CHF, COPD, HTn, lung CA, soft palate CA  Clinical Impression   Patient received in bed, sound asleep but able to be woken by stimulation from PT and nursing staff. He required ModA to initiate functional bed mobility however once initiated able to perform with Min guard to S today; sat EOB for a few minutes, then he refused further intervention and returned to supine with S. Feel that patient is likely highly mobile and current mobility deficits/refusal to participate are likely behavioral. For now recommending return home with 24/7 S and continued participation at [facility] program. Will update recommendations if appropriate/if patient will participate further in PT in future sessions.     Follow Up Recommendations Other (comment)(continue with PT at [facility] and increased frequency )    Equipment Recommendations  None recommended by PT    Recommendations for Other Services       Precautions / Restrictions Precautions Precautions: Fall Restrictions Weight Bearing Restrictions: No      Mobility  Bed Mobility Overal bed mobility: Needs Assistance Bed Mobility: Supine to Sit;Sit to Supine     Supine to sit: Mod assist Sit to supine: Supervision   General bed mobility comments: ModA to initiate supine to sit but once initiated able to perform with S-min guard. Attempted to progress mobiltiy but patient refused and returned himself to supine with S.   Transfers                 General transfer comment: patient  refused   Ambulation/Gait             General Gait Details: patient refused   Stairs            Wheelchair Mobility    Modified Rankin (Stroke Patients Only)       Balance Overall balance assessment: Needs assistance Sitting-balance support: Feet supported;No upper extremity supported Sitting balance-Leahy Scale: Good                                       Pertinent Vitals/Pain Pain Assessment: Faces Pain Score: 0-No pain Faces Pain Scale: No hurt    Home Living Family/patient expects to be discharged to:: Private residence Living Arrangements: Spouse/significant other Available Help at Discharge: Family;Available 24 hours/day Type of Home: House Home Access: Stairs to enter Entrance Stairs-Rails: Left Entrance Stairs-Number of Steps: 9 Home Layout: One level Home Equipment: Shower seat Additional Comments: [facility] monday and wed--adult daycare    Prior Function Level of Independence: Needs assistance   Gait / Transfers Assistance Needed: supervision for mobility   ADL's / Homemaking Assistance Needed: requires supervision for bathing and dressing (able to dress, but spouse assist to ensure he takes of old clothing first), inconsistent incontience          Hand Dominance   Dominant Hand: Right    Extremity/Trunk Assessment   Upper Extremity Assessment Upper Extremity Assessment: Defer to OT evaluation    Lower Extremity Assessment Lower Extremity Assessment: Overall  WFL for tasks assessed    Cervical / Trunk Assessment Cervical / Trunk Assessment: Normal  Communication   Communication: Other (comment)(non-verbal )  Cognition Arousal/Alertness: Awake/alert Behavior During Therapy: Flat affect Overall Cognitive Status: History of cognitive impairments - at baseline                                 General Comments: History of dementia at baseline; follows simple 1 step commands with encouragement      General  Comments      Exercises     Assessment/Plan    PT Assessment Patient needs continued PT services  PT Problem List Decreased balance;Decreased cognition;Decreased mobility;Decreased coordination;Decreased safety awareness       PT Treatment Interventions Functional mobility training;Balance training;Patient/family education;Gait training;Therapeutic activities;Neuromuscular re-education;Stair training;Therapeutic exercise    PT Goals (Current goals can be found in the Care Plan section)  Acute Rehab PT Goals Patient Stated Goal: to take him home  PT Goal Formulation: With family Time For Goal Achievement: 03/22/19 Potential to Achieve Goals: Good    Frequency Min 3X/week   Barriers to discharge        Co-evaluation               AM-PAC PT "6 Clicks" Mobility  Outcome Measure Help needed turning from your back to your side while in a flat bed without using bedrails?: None Help needed moving from lying on your back to sitting on the side of a flat bed without using bedrails?: A Little Help needed moving to and from a bed to a chair (including a wheelchair)?: A Little Help needed standing up from a chair using your arms (e.g., wheelchair or bedside chair)?: A Little Help needed to walk in hospital room?: A Little Help needed climbing 3-5 steps with a railing? : A Little 6 Click Score: 19    End of Session   Activity Tolerance: Patient tolerated treatment well Patient left: in bed;with call bell/phone within reach;with bed alarm set;with nursing/sitter in room;with family/visitor present   PT Visit Diagnosis: Unsteadiness on feet (R26.81)    Time: 0240-9735 PT Time Calculation (min) (ACUTE ONLY): 20 min   Charges:   PT Evaluation $PT Eval Moderate Complexity: 1 Mod          Deniece Ree PT, DPT, CBIS  Supplemental Physical Therapist [place]    Pager [phone] Acute Rehab Office [phone]

## 2019-03-23 LAB — CBC
HCT: 32.4 % — ABNORMAL LOW (ref 39.0–52.0)
Hemoglobin: 10.5 g/dL — ABNORMAL LOW (ref 13.0–17.0)
MCH: 30.3 pg (ref 26.0–34.0)
MCHC: 32.4 g/dL (ref 30.0–36.0)
MCV: 93.4 fL (ref 80.0–100.0)
Platelets: 175 10*3/uL (ref 150–400)
RBC: 3.47 MIL/uL — ABNORMAL LOW (ref 4.22–5.81)
RDW: 13.2 % (ref 11.5–15.5)
WBC: 10.5 10*3/uL (ref 4.0–10.5)
nRBC: 0 % (ref 0.0–0.2)

## 2019-03-23 LAB — BASIC METABOLIC PANEL
Anion gap: 8 (ref 5–15)
BUN: 17 mg/dL (ref 8–23)
CO2: 20 mmol/L — ABNORMAL LOW (ref 22–32)
Calcium: 8 mg/dL — ABNORMAL LOW (ref 8.9–10.3)
Chloride: 110 mmol/L (ref 98–111)
Creatinine, Ser: 1.32 mg/dL — ABNORMAL HIGH (ref 0.61–1.24)
GFR calc Af Amer: >60 mL/min (ref >60)
GFR calc non Af Amer: 55 mL/min — ABNORMAL LOW (ref >60)
Glucose, Bld: 98 mg/dL (ref 70–99)
Potassium: 3.1 mmol/L — ABNORMAL LOW (ref 3.5–5.1)
Sodium: 138 mmol/L (ref 135–145)

## 2019-03-23 MED ORDER — SULFAMETHOXAZOLE-TRIMETHOPRIM 200-40 MG/5ML PO SUSP: 20.0000 mL | Freq: Two times a day (BID) | ORAL | 0 refills | Status: AC

## 2019-03-23 MED ORDER — POTASSIUM CHLORIDE 20 MEQ/15ML (10%) PO SOLN
40.0000 meq | Freq: Two times a day (BID) | ORAL | Status: AC
  Administered 2019-03-23 (×2): 40 meq via ORAL
  Filled 2019-03-23 (×2): qty 30

## 2019-03-23 MED ORDER — SULFAMETHOXAZOLE-TRIMETHOPRIM 200-40 MG/5ML PO SUSP: 20.0000 mL | Freq: Two times a day (BID) | ORAL | Status: DC

## 2019-03-23 NOTE — Care Management (Addendum)
PT recommending walker and 3 in 1 for home . Patient and wife agreeable. Called [facility] social worker Benita [phone] awaiting call back. Text page MD for orders.   Magdalen Spatz RN BSN [phone]

## 2019-03-23 NOTE — Progress Notes (Signed)
Occupational Therapy Evaluation Patient Details Name: Bryan Arellano MRN: [id number] DOB: 03/20/1951 Today's Date: 03/23/2019    History of Present Illness 68yo male with recent hospital admission March 5th-9th due to abdominal distension and urinary retention, permanent catheter placed and patient was DCed. He then had catheter removed and then replaced at [facility]. Then found in AMS and unconscious by wife and taken to the ED. Admitted for sepsis secondary to UTI. PMH MI, A-fib, CHF, COPD, HTn, lung CA, soft palate CA   Clinical Impression   Patient participating in evaluation with spouse present.  He requires verbal cues and tactile cues to complete functional mobility transfers and to participate in ADLs.  Without use of RW, he requires +2 min-mod assist for balance/support and to manage lines.  However, with use of RW, his safety and independence with functional mobility greatly improved, requiring decreasing level of assist until min guard by end of evaluation.  His wife demonstrated independence with assisting him with ADLs and and functional mobility when he is using RW.  Recommending 3n1 for use at home, and pt's wife is agreeable to this recommendation.  Pt will require 24/7 supervision/assist upon return home, which wife is willing and able to provide.  Once he is home, recommend continuing with [facility] program in community. Pt plans to discharge home today. However, he remains in hospital throughout week, he will benefit from continued acute occupational therapy to maximize independence and safety with ADLs in preparation for return home.    Follow Up Recommendations  No OT follow up;Supervision/Assistance - 24 hour(continue with therapies at [facility] program)    Equipment Recommendations  3 in 1 bedside commode    Recommendations for Other Services       Precautions / Restrictions Precautions Precautions: Fall      Mobility Bed Mobility Overal bed mobility: Needs Assistance Bed Mobility:  Supine to Sit     Supine to sit: Min assist     General bed mobility comments: Min physical assist with max verbal and tactile cues to initiate and complete bed mobility to reach EOB sitting.   Transfers Overall transfer level: Needs assistance Equipment used: 2 person hand held assist;Rolling walker (2 wheeled) Transfers: Sit to/from Stand Sit to Stand: +2 physical assistance;Min assist;Min guard         General transfer comment: Initially stood from bed with +2 min assist (without use of RW).  When returning to room to sit in recliner, he completed stand<>sit to recliner with min guard.    Balance Overall balance assessment: Needs assistance Sitting-balance support: Feet supported;No upper extremity supported Sitting balance-Leahy Scale: Good     Standing balance support: Bilateral upper extremity supported Standing balance-Leahy Scale: Fair                             ADL either performed or assessed with clinical judgement   ADL Overall ADL's : Needs assistance/impaired Eating/Feeding: Supervision/ safety;Sitting   Grooming: Brushing hair;Maximal assistance;Sitting   Upper Body Bathing: Moderate assistance;Sitting   Lower Body Bathing: Moderate assistance;Sit to/from stand   Upper Body Dressing : Moderate assistance;Sitting   Lower Body Dressing: Moderate assistance;Sit to/from stand   Toilet Transfer: +2 for physical assistance;Minimal assistance;Moderate assistance;Ambulation;Regular Glass blower/designer Details (indicate cue type and reason): Simulated toilet transfer within room. Pt requiring +2 assist with varying min-mod assist for balance/support and for safety with lines/leads.          Functional mobility during  ADLs: Min guard;Rolling walker General ADL Comments: Pt initially +2 assist with varying min-mod assist for balance/support and safety with lines.  When presented with RW, he initially required mod assist for safe use of RW but  therapist able to decrease level of assist to min guard by end of session.  His wife was present during eval and demonstrated independence with assisting him with ADLs. Discussed use of 3n1 at home and recommended this for discharge plan home.  Pt's wife in agreement with recommendation.     Vision Patient Visual Report: No change from baseline       Perception     Praxis      Pertinent Vitals/Pain Pain Assessment: Faces Faces Pain Scale: No hurt     Hand Dominance Right   Extremity/Trunk Assessment Upper Extremity Assessment Upper Extremity Assessment: Overall WFL for tasks assessed   Lower Extremity Assessment Lower Extremity Assessment: Defer to PT evaluation       Communication Communication Communication: Other (comment)(non verbal)   Cognition Arousal/Alertness: Awake/alert Behavior During Therapy: Flat affect Overall Cognitive Status: History of cognitive impairments - at baseline Area of Impairment: Following commands;Problem solving                       Following Commands: Follows one step commands inconsistently;Follows one step commands with increased time     Problem Solving: Slow processing;Decreased initiation;Requires verbal cues;Requires tactile cues General Comments: H/o dementia   General Comments       Exercises     Shoulder Instructions      Home Living Family/patient expects to be discharged to:: Private residence Living Arrangements: Spouse/significant other Available Help at Discharge: Family;Available 24 hours/day Type of Home: House Home Access: Stairs to enter [employer] of Steps: 9 Entrance Stairs-Rails: Left Home Layout: One level     Bathroom Shower/Tub: Teacher, early years/pre: Standard     Home Equipment: Civil engineer, contracting   Additional Comments: [facility] monday and wed--adult daycare      Prior Functioning/Environment Level of Independence: Needs assistance  Gait / Transfers Assistance Needed:  supervision for mobility  ADL's / Homemaking Assistance Needed: requires supervision for bathing and dressing (able to dress, but spouse assist to ensure he takes of old clothing first), inconsistent incontience              OT Problem List: Impaired balance (sitting and/or standing);Decreased activity tolerance;Decreased safety awareness;Decreased knowledge of use of DME or AE      OT Treatment/Interventions: Self-care/ADL training;DME and/or AE instruction;Therapeutic activities;Patient/family education;Balance training    OT Goals(Current goals can be found in the care plan section) Acute Rehab OT Goals Patient Stated Goal: to take him home  OT Goal Formulation: With family Time For Goal Achievement: 04/06/19 Potential to Achieve Goals: Good ADL Goals Pt Will Perform Grooming: with supervision;standing Pt Will Perform Upper Body Dressing: with supervision;sitting Pt Will Perform Lower Body Dressing: with supervision;sit to/from stand Pt Will Transfer to Toilet: with supervision;ambulating;bedside commode(with RW) Pt Will Perform Tub/Shower Transfer: Tub transfer;with supervision;3 in 1  OT Frequency: Min 2X/week   Barriers to D/C:            Co-evaluation PT/OT/SLP Co-Evaluation/Treatment: Yes Reason for Co-Treatment: For patient/therapist safety   OT goals addressed during session: ADL's and self-care;Proper use of Adaptive equipment and DME      AM-PAC OT "6 Clicks" Daily Activity     Outcome Measure Help from another person eating meals?: None Help from  another person taking care of personal grooming?: A Little Help from another person toileting, which includes using toliet, bedpan, or urinal?: A Little Help from another person bathing (including washing, rinsing, drying)?: A Little Help from another person to put on and taking off regular upper body clothing?: A Little Help from another person to put on and taking off regular lower body clothing?: A Little 6 Click  Score: 19   End of Session Equipment Utilized During Treatment: Gait belt;Rolling walker Nurse Communication: Mobility status  Activity Tolerance: Patient tolerated treatment well Patient left: in chair;with call bell/phone within reach;with nursing/sitter in room;with family/visitor present  OT Visit Diagnosis: Unsteadiness on feet (R26.81);Muscle weakness (generalized) (M62.81)                Time: 7340-3709 OT Time Calculation (min): 27 min Charges:  OT General Charges $OT Visit: 1 Visit OT Evaluation $OT Eval Low Complexity: 1 Low    Darrol Jump OTR/L Acute Rehab PRN 03/23/2019, 12:00 PM

## 2019-03-23 NOTE — Progress Notes (Signed)
Physical Therapy Treatment Patient Details Name: Bryan Arellano MRN: [id number] DOB: 11-Oct-1951 Today's Date: 03/23/2019    History of Present Illness 68yo male with recent hospital admission March 5th-9th due to abdominal distension and urinary retention, permanent catheter placed and patient was DCed. He then had catheter removed and then replaced at [facility]. Then found in AMS and unconscious by wife and taken to the ED. Admitted for sepsis secondary to UTI. PMH MI, A-fib, CHF, COPD, HTn, lung CA, soft palate CA    PT Comments    Continuing work on functional mobility and activity tolerance;  Better participation in physical activity today; able to walk the hallways with min assist; much more steady with RW, but needing cues for correct RW use;    Pt will require 24/7 supervision/assist upon return home, which wife is willing and able to provide.  Once he is home, recommend continuing with [facility] program in community. Pt plans to discharge home today. However, he remains in hospital throughout week, he will benefit from continued acute PT to maximize independence and safety with mobility prior to dc home  Follow Up Recommendations  Other (comment)(continue with PT at [facility] and increased frequency )     Equipment Recommendations  Rolling walker with 5" wheels;3in1 (PT)    Recommendations for Other Services       Precautions / Restrictions Precautions Precautions: Fall    Mobility  Bed Mobility Overal bed mobility: Needs Assistance Bed Mobility: Supine to Sit     Supine to sit: Min assist     General bed mobility comments: Min physical assist with max verbal and tactile cues to initiate and complete bed mobility to reach EOB sitting.   Transfers Overall transfer level: Needs assistance Equipment used: 2 person hand held assist;Rolling walker (2 wheeled) Transfers: Sit to/from Stand Sit to Stand: +2 physical assistance;Min assist;Min guard         General transfer comment:  Initially stood from bed with +2 min assist (without use of RW).  When returning to room to sit in recliner, he completed stand<>sit to recliner with min guard.  Ambulation/Gait Ambulation/Gait assistance: Min guard;Mod assist Gait Distance (Feet): 400 Feet Assistive device: 1 person hand held assist;Rolling walker (2 wheeled) Gait Pattern/deviations: Step-through pattern;Decreased stride length     General Gait Details: Initially without assistive device, and required light mod assist for balance with turns; once in clear hallways, better balance with min assist to occasionally steady; MUCH better balance and steadiness with use of RW for amb, cues for hand placement, RW use and proximity; improved step lenght with use of rW   Stairs             Wheelchair Mobility    Modified Rankin (Stroke Patients Only)       Balance Overall balance assessment: Needs assistance Sitting-balance support: Feet supported;No upper extremity supported Sitting balance-Leahy Scale: Good     Standing balance support: Bilateral upper extremity supported Standing balance-Leahy Scale: Fair                              Cognition Arousal/Alertness: Awake/alert Behavior During Therapy: Flat affect Overall Cognitive Status: History of cognitive impairments - at baseline Area of Impairment: Following commands;Problem solving                       Following Commands: Follows one step commands inconsistently;Follows one step commands with increased time  Problem Solving: Slow processing;Decreased initiation;Requires verbal cues;Requires tactile cues General Comments: H/o dementia      Exercises      General Comments General comments (skin integrity, edema, etc.): spouse present and supportive      Pertinent Vitals/Pain Pain Assessment: Faces Faces Pain Scale: No hurt    Home Living Family/patient expects to be discharged to:: Private residence Living  Arrangements: Spouse/significant other Available Help at Discharge: Family;Available 24 hours/day Type of Home: House Home Access: Stairs to enter Entrance Stairs-Rails: Left Home Layout: One level Home Equipment: Shower seat Additional Comments: [facility] monday and wed--adult daycare    Prior Function Level of Independence: Needs assistance  Gait / Transfers Assistance Needed: supervision for mobility  ADL's / Homemaking Assistance Needed: requires supervision for bathing and dressing (able to dress, but spouse assist to ensure he takes of old clothing first), inconsistent incontience       PT Goals (current goals can now be found in the care plan section) Acute Rehab PT Goals Patient Stated Goal: to take him home  PT Goal Formulation: With family Time For Goal Achievement: 03/22/19 Potential to Achieve Goals: Good Progress towards PT goals: Progressing toward goals    Frequency    Min 3X/week      PT Plan Current plan remains appropriate    Co-evaluation PT/OT/SLP Co-Evaluation/Treatment: Yes Reason for Co-Treatment: Necessary to address cognition/behavior during functional activity;For patient/therapist safety PT goals addressed during session: Mobility/safety with mobility OT goals addressed during session: ADL's and self-care;Proper use of Adaptive equipment and DME      AM-PAC PT "6 Clicks" Mobility   Outcome Measure  Help needed turning from your back to your side while in a flat bed without using bedrails?: None Help needed moving from lying on your back to sitting on the side of a flat bed without using bedrails?: A Little Help needed moving to and from a bed to a chair (including a wheelchair)?: A Little Help needed standing up from a chair using your arms (e.g., wheelchair or bedside chair)?: A Little Help needed to walk in hospital room?: A Little Help needed climbing 3-5 steps with a railing? : A Little 6 Click Score: 19    End of Session Equipment Utilized  During Treatment: Gait belt Activity Tolerance: Patient tolerated treatment well Patient left: in chair;with call bell/phone within reach Nurse Communication: Mobility status PT Visit Diagnosis: Unsteadiness on feet (R26.81)     Time: 2620-3559 PT Time Calculation (min) (ACUTE ONLY): 30 min  Charges:  $Gait Training: 8-22 mins                     Roney Marion, [place] Pager [phone] Office [phone]    Colletta [place] 03/23/2019, 1:24 PM

## 2019-03-23 NOTE — Discharge Summary (Signed)
Name: Bryan Arellano MRN: [id number] DOB: Oct 18, 1951 68 y.o. PCP: Patient, No Pcp Per  Date of Admission: 03/18/2019  8:33 PM Date of Discharge: 03/23/2019 Attending Physician: Dr. Dareen Piano  Discharge Diagnosis: 1. Proteus penneri bacteremia 2/2 urosepsis  2. Urinary retention 3. AKI  Discharge Medications: Allergies as of 03/23/2019   No Known Allergies     Medication List    STOP taking these medications   QUEtiapine 50 MG tablet Commonly known as:  SEROquel     TAKE these medications   acetaminophen 325 MG tablet Commonly known as:  TYLENOL Take 2 tablets (650 mg total) by mouth every 6 (six) hours as needed for mild pain (or Fever >/= 101).   aspirin 81 MG tablet Take 81 mg by mouth daily.   [employer] Melatonin 5 MG Tabs Generic drug:  Melatonin Take 5 mg by mouth at bedtime.   donepezil 10 MG tablet Commonly known as:  ARICEPT TAKE 1 TABLET BY MOUTH ONCE DAILY(MIDDAY) What changed:  See the new instructions.   levothyroxine 50 MCG tablet Commonly known as:  SYNTHROID, LEVOTHROID Take 50 mcg by mouth daily.   mirtazapine 7.5 MG tablet Commonly known as:  REMERON Take 7.5 mg by mouth at bedtime.   Rocklatan 0.02-0.005 % Soln Generic drug:  Netarsudil-Latanoprost Place 1 drop into both eyes at bedtime.   simvastatin 10 MG tablet Commonly known as:  ZOCOR Take 1 tablet (10 mg total) by mouth every evening.   sulfamethoxazole-trimethoprim 200-40 MG/5ML suspension Commonly known as:  BACTRIM,SEPTRA Take 20 mLs by mouth every 12 (twelve) hours for 13 doses.   tamsulosin 0.4 MG Caps capsule Commonly known as:  FLOMAX Take 1 capsule (0.4 mg total) by mouth daily. What changed:  Another medication with the same name was removed. Continue taking this medication, and follow the directions you see here.       Disposition and follow-up:   Mr.Jarmaine Kuiken was discharged from [facility] in Stable condition.  At the hospital follow up visit please  address:  1. Proteus penneri bacteremia 2/2 urosepsis  - Please ensure patient completes full 10-day course of abx (Bactrim, to end 3/30)  2. Urinary retention - Foley catheter was maintained during hospitalization - Please ensure patient has appropriate f/u with urology  3. AKI - Cr was 1.3 at discharge. BL <1.  - Encouraged PO intake  - Please reassess kidney function at f/u  4.  Labs / imaging needed at time of follow-up: BMP  5.  Pending labs/ test needing follow-up: none  Follow-up Appointments:   Hospital Course by problem list: 1. Proteus penneri bacteremia 2/2 urosepsis: 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 Blood cx and urine cx grew proteus penneri, resistant to ceftriaxone. He was treated with IV meropenem and transitioned to Bactrim when he was clinically improved and his leukocytosis resolved. He is to complete a 10-day course of abx to end 3/30. He was prescribed the liquid version of the antibiotic for ease of administration at home. He was discharged home under the care of his wife, who declined SNF placement.    2. Urinary retention: Patient presented with foley catheter in place. He failed an outpatient voiding trial prior to admission. Foley catheter was maintained.  He will require f/u with urology.   3. AKI: Cr elevated to 1.9 on admission. Likely prerenal in the setting of infection and dehydration. Cr improved to 1.3 on discharged with IVF. He should f/u with his PCP to reassess his kidney function.  Discharge Vitals:   BP 123/75 (BP Location: Right Arm)   Pulse 93   Temp 98.5 F (36.9 C) (Oral)   Resp 20   Ht 5\' 4"  (1.626 m)   Wt 56.2 kg  Comment: per wife  SpO2 98%   BMI 21.28 kg/m   Pertinent Labs, Studies, and Procedures:  CBC Latest Ref Rng & Units 03/23/2019 03/22/2019 03/21/2019  WBC 4.0 - 10.5 K/uL 10.5 12.2(H) 13.3(H)  Hemoglobin 13.0 - 17.0 g/dL 10.5(L) 12.2(L) 11.8(L)  Hematocrit 39.0 - 52.0 % 32.4(L) 35.9(L) 35.5(L)  Platelets 150 - 400 K/uL 175 172 185   CMP Latest Ref Rng & Units 03/23/2019 03/22/2019 03/21/2019  Glucose 70 - 99 mg/dL 98 91 109(H)  BUN 8 - 23 mg/dL 17 17 18   Creatinine 0.61 - 1.24 mg/dL 1.32(H) 1.50(H) 1.52(H)  Sodium 135 - 145 mmol/L 138 140 143  Potassium 3.5 - 5.1 mmol/L 3.1(L) 3.7 3.1(L)  Chloride 98 - 111 mmol/L 110 109 108  CO2 22 - 32 mmol/L 20(L) 22 23  Calcium 8.9 - 10.3 mg/dL 8.0(L) 8.3(L) 8.6(L)  Total Protein 6.5 - 8.1 g/dL - - -  Total Bilirubin 0.3 - 1.2 mg/dL - - -  Alkaline Phos 38 - 126 U/L - - -  AST 15 - 41 U/L - - -  ALT 0 - 44 U/L - - -     Discharge Instructions: Discharge Instructions    Diet - low sodium heart healthy   Complete by:  As directed    Discharge instructions   Complete by:  As directed    Mrs. Dory,  It was a pleasure taking care of your husband during his hospital stay.   He was hospitalized for a urinary tract infection that spread to his blood. He was treated with IV fluids and antibiotics. He will need to continue antibiotics for six more days. I have prescribed liquid Bactrim. He should take 82ml of this medication two times a day. Please administer his first dose tonight.   Please follow-up with his PCP and with his urologist.  Please see a doctor sooner if he experiences fever or confusion.   Thanks, Dr. Annie Paras   Increase activity slowly   Complete by:  As directed       Signed: Dorrell, Andree Elk, MD 03/23/2019, 10:08 AM   Pager: [phone]

## 2019-03-23 NOTE — TOC Transition Note (Signed)
Transition of Care [facility]) - CM/SW Discharge Note   Patient Details  Name: Bryan Arellano MRN: [id number] Date of Birth: 1951/11/20  Transition of Care [facility]) CM/SW Contact:  Alexander Mt, [place] Phone Number: 03/23/2019, 10:05 AM   Clinical Narrative:    Pt wife prefers for pt to d/c home.  Called and left message with pt CSW at [facility], await return call.   Final next level of care: Home/Self Care Barriers to Discharge: Barriers Resolved   Patient Goals and CMS Choice Patient states their goals for this hospitalization and ongoing recovery are:: for him to have care and supervision CMS Medicare.gov Compare Post Acute Care list provided to:: Patient Represenative (must comment) Choice offered to / list presented to : Spouse  Discharge Placement PASRR number recieved: 03/22/19             Discharge Plan and Services In-house Referral: NA Discharge Planning Services: NA Post Acute Care Choice: [place]                    Social Determinants of Health (SDOH) Interventions     Readmission Risk Interventions Readmission Risk Prevention Plan 03/22/2019  Transportation Screening Complete  PCP or Specialist Appt within 3-5 Days Complete  HRI or [place] Complete  Social Work Consult for [place] Planning/Counseling Complete  Palliative Care Screening Not Applicable  Medication Review [employer] photographer) Complete  Some recent data might be hidden

## 2019-03-23 NOTE — Care Management (Signed)
Called [facility] spoke to [place] [phone]. Nancy instructed NCM to fax orders for walker and 3 in 1 to her at [phone]. Izora Gala will order walker and 3 in 1 and have them delivered to patient's home. Patient and wife aware and in agreement. Wife plans to drive patient home.   Orders faxed.   Magdalen Spatz RN [phone]

## 2019-03-23 NOTE — Progress Notes (Signed)
   Subjective: No overnight events. Bryan Arellano reports that the patient slept all day yesterday and didn't eat much. Today, Bryan Arellano opens his eyes and nods yes and no to questions, but falls asleep quickly. His wife reports that she would prefer to take him home with a liquid antibiotic instead of having him go to SNF.   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  Proteus bacteremia 2/2 urosepsis - Blood cx and urine cx grew proteus penneri, resistant to ceftriaxone.  - Afebrile and hemodynamically stable. Leukocytosis has resolved. Patient overall appears improved clinically although he continues to be more tired than baseline. Plan -Transition to PO bactrim today (day 4/10), liquid solution  - Discharge home today as patient's wife is declining SNF  Acute kidney injury - Crimproved from 1.9 on admission to 1.3today. BL is around 1. Likely prerenal in the setting of infection and dehydration. Plan -Discontinue IVF at discharge. Encourage PO intake. - F/u with PCP for repeat BMP  Dementia -Continue  donezepil and mirtazapine  Dispo: Anticipated discharge today.  Bryan Arellano, Bryan Elk, Bryan Arellano 03/23/2019, 6:38 AM Pager: [phone]

## 2019-03-23 NOTE — Progress Notes (Signed)
Patient discharged to home. Patient's wife verbalizes understanding of all discharge instructions including discharge medications and patients equipment will be delivered at their home by [facility]. No questions at this time. Patient will be transferred by wife's personal car.

## 2019-04-14 ENCOUNTER — Other Ambulatory Visit: Payer: Self-pay

## 2019-04-14 ENCOUNTER — Emergency Department (HOSPITAL_COMMUNITY)
Admission: EM | Admit: 2019-04-14 | Discharge: 2019-04-14 | Discharge status: Against Medical Advice | Payer: Medicare (Managed Care)

## 2019-04-14 ENCOUNTER — Encounter (HOSPITAL_COMMUNITY): Payer: Self-pay

## 2019-04-14 ENCOUNTER — Emergency Department (HOSPITAL_COMMUNITY)
Admission: EM | Admit: 2019-04-14 | Discharge: 2019-04-15 | Disposition: A | Discharge status: Home / Self Care | Payer: Medicare (Managed Care) | Attending: Emergency Medicine | Admitting: Emergency Medicine

## 2019-04-14 DIAGNOSIS — N189 Chronic kidney disease, unspecified: Secondary | ICD-10-CM | POA: Insufficient documentation

## 2019-04-14 DIAGNOSIS — J449 Chronic obstructive pulmonary disease, unspecified: Secondary | ICD-10-CM | POA: Diagnosis not present

## 2019-04-14 DIAGNOSIS — E039 Hypothyroidism, unspecified: Secondary | ICD-10-CM | POA: Diagnosis not present

## 2019-04-14 DIAGNOSIS — Z87891 Personal history of nicotine dependence: Secondary | ICD-10-CM | POA: Insufficient documentation

## 2019-04-14 DIAGNOSIS — I130 Hypertensive heart and chronic kidney disease with heart failure and stage 1 through stage 4 chronic kidney disease, or unspecified chronic kidney disease: Secondary | ICD-10-CM | POA: Diagnosis not present

## 2019-04-14 DIAGNOSIS — R339 Retention of urine, unspecified: Secondary | ICD-10-CM | POA: Diagnosis not present

## 2019-04-14 DIAGNOSIS — G309 Alzheimer's disease, unspecified: Secondary | ICD-10-CM | POA: Insufficient documentation

## 2019-04-14 DIAGNOSIS — Z7982 Long term (current) use of aspirin: Secondary | ICD-10-CM | POA: Diagnosis not present

## 2019-04-14 DIAGNOSIS — I509 Heart failure, unspecified: Secondary | ICD-10-CM | POA: Insufficient documentation

## 2019-04-14 DIAGNOSIS — F0280 Dementia in other diseases classified elsewhere without behavioral disturbance: Secondary | ICD-10-CM | POA: Insufficient documentation

## 2019-04-14 DIAGNOSIS — Z79899 Other long term (current) drug therapy: Secondary | ICD-10-CM | POA: Insufficient documentation

## 2019-04-14 DIAGNOSIS — I252 Old myocardial infarction: Secondary | ICD-10-CM | POA: Insufficient documentation

## 2019-04-14 NOTE — ED Triage Notes (Signed)
Pt with wife, pt nonverbal with trach and hz of dementia. Pt presents to ed due to foley catheter problems. Wife noticed blood filling up in bag after pt pulling at catheter, called hh rn, hh rn tried to reinsert catheter but couldn't advance it. Bladder scan In process

## 2019-04-14 NOTE — ED Notes (Addendum)
Pts daughter sts patient pulled foley out. She requested a condom cath, but then sts he can't pass urine.  Pts daughter highly anxious of patient being near [place], she sts pt has a trach and a depressed immune system. She wants to take him to [facility] instead of being at [facility] because she doesn't want him exposed to covid.  This Probation officer offered numerous times to have a provider see patient, she declined saying she can see urology in the am.  She did not want to leave patient in the ED and wait in the car in the parking lot.  Pt is alert and oriented to self, pt in very good spirits, he ambulated out with his daughter. No distress noted.  Charted by [facility]

## 2019-04-14 NOTE — ED Notes (Signed)
Bladder scan volume 122ml

## 2019-04-15 LAB — URINALYSIS, ROUTINE W REFLEX MICROSCOPIC
Bilirubin Urine: NEGATIVE
Glucose, UA: NEGATIVE mg/dL
Ketones, ur: 5 mg/dL — AB
Nitrite: NEGATIVE
Protein, ur: 100 mg/dL — AB
RBC / HPF: >50 RBC/hpf — ABNORMAL HIGH (ref 0–5)
Specific Gravity, Urine: 1.014 (ref 1.005–1.030)
pH: 7 (ref 5.0–8.0)

## 2019-04-15 NOTE — ED Notes (Signed)
Patients wife is requesting to leave. States "We've been here long enough. He's got the catheter and that's all we needed". Informed her we are waiting on the urine to come back but she still insists on going home.   MD made aware.

## 2019-04-15 NOTE — ED Notes (Addendum)
Standard drainage bag changed to leg bag before discharge.   Patient caregiver verbalizes understanding of discharge instructions. No further questions at this time. VSS and patient ambulatory at discharge.

## 2019-04-15 NOTE — ED Provider Notes (Signed)
[facility] EMERGENCY DEPARTMENT Provider Note   CSN: [id number] Arrival date & time: 04/14/19  2053    History   Chief Complaint Chief Complaint  Patient presents with  . Urinary Retention    HPI Bryan Arellano is a 68 y.o. male.  The history is provided by the spouse. The history is limited by the condition of the patient (Dementia).  He has history of hypertension, chronic kidney disease, heart failure, COPD, atrial fibrillation and has had an indwelling Foley catheter for the last month.  Today, he pulled at the catheter.  A visiting nurse tried to reposition it and it started draining blood.  The catheter was removed and was unable to be reinserted.  He has been unable to urinate since then.  Past Medical History:  Diagnosis Date  . Acute MI ([place]) 09/25/2002   hx/notes 05/15/2011  . Atrial fibrillation ([place])    hx/notes 05/15/2011  . Carotid artery occlusion   . CHF (congestive heart failure) ([place]) 09/25/2002   hx/notes 05/15/2011  . Chronic renal failure    hx/notes 05/15/2011  . COPD (chronic obstructive pulmonary disease) ([place])    hx/notes 05/15/2011  . GERD (gastroesophageal reflux disease)    hx/notes 05/15/2011  . History of radiation therapy 1999  . Hypertension    hx/notes 05/15/2011  . Hypothyroidism    hx/notes 05/15/2011  . Internal carotid artery stenosis, right    Archie Endo 03/25/2017  . Iron deficiency anemia    hx/notes 05/15/2011  . Lung cancer ([place])    hx/notes 05/15/2011  . Pain around PEG tube site   . Prostate cancer ([place])    hx/notes 05/15/2011  . PVD (peripheral vascular disease) ([place])    hx/notes 05/15/2011  . 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 Acute pyelonephritis 03/21/2019  . Sepsis ([place]) 03/19/2019  . AKI (acute kidney injury) ([place])   . Bladder outlet obstruction   . Hydronephrosis, bilateral   . Urinary  retention 03/04/2019  . Tracheostomy present ([place])   . History of head and neck cancer   . Other specified hypothyroidism   . 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 Laryngitis 03/26/2017  . Dysphagia 03/26/2017  . Odynophagia 03/26/2017  . Pharyngitis 03/25/2017  . Carotid artery stenosis, asymptomatic, bilateral 01/02/2017  . Moderate dementia with behavioral disturbance ([place]) 03/05/2015  . Cancer of soft palate ([place]) 03/05/2015  . Occlusion and stenosis of carotid artery without mention of cerebral infarction 02/25/2013  . 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 DYSPNEA 08/29/2008  . RADIATION THERAPY, HX OF 08/29/2008    Past Surgical History:  Procedure Laterality Date  . AORTO-FEMORAL BYPASS GRAFT  1981   hx/notes 05/15/2011  . CATARACT EXTRACTION     hx/notes 05/15/2011  . DIRECT LARYNGOSCOPY N/A 03/27/2017   Procedure: DIRECT LARYNGOSCOPY;  Surgeon: Melissa Montane, MD;  Location: [facility];  Service: ENT;  Laterality: N/A;  . ESOPHAGOSCOPY N/A 03/27/2017   Procedure: ESOPHAGOSCOPY;  Surgeon: Melissa Montane, MD;  Location: [facility] OR;  Service: ENT;  Laterality: N/A;  . GASTROSTOMY TUBE PLACEMENT  2013   TUBE LATER REMOVED in 2013  . PSEUDOANEURYSM REPAIR  2002   hx/notes 05/15/2011  . TRANSURETHRAL RESECTION OF BLADDER TUMOR WITH GYRUS (TURBT-GYRUS)     hx/notes 05/15/2011  Home Medications    Prior to Admission medications   Medication Sig Start Date End Date Taking? Authorizing Provider  acetaminophen (TYLENOL) 325 MG tablet Take 2 tablets (650 mg total) by mouth every 6 (six) hours as needed for mild pain (or Fever >/= 101). Patient not taking: Reported on 03/24/2018 03/28/17   Robbie Lis, MD  aspirin 81 MG tablet Take 81 mg by mouth daily.    [provider]  donepezil (ARICEPT) 10 MG tablet TAKE 1 TABLET BY MOUTH ONCE DAILY(MIDDAY) Patient taking differently: Take 10 mg by mouth daily.  03/30/18    Cameron Sprang, MD  levothyroxine (SYNTHROID, LEVOTHROID) 50 MCG tablet Take 50 mcg by mouth daily.  01/09/18   [provider]  Melatonin ([employer] MELATONIN) 5 MG TABS Take 5 mg by mouth at bedtime.    [provider]  mirtazapine (REMERON) 7.5 MG tablet Take 7.5 mg by mouth at bedtime.    [provider]  ROCKLATAN 0.02-0.005 % SOLN Place 1 drop into both eyes at bedtime. 12/01/18   [provider]  simvastatin (ZOCOR) 10 MG tablet Take 1 tablet (10 mg total) by mouth every evening. 09/29/14   Rhyne, Hulen Shouts, PA-C  tamsulosin (FLOMAX) 0.4 MG CAPS capsule Take 1 capsule (0.4 mg total) by mouth daily. 03/09/19   Dorrell, Andree Elk, MD    Family History Family History  Problem Relation Age of Onset  . Heart disease Sister        before age 73  . Clotting disorder Sister   . Cancer Brother   . Heart disease Brother   . Heart attack Brother     Social History Social History   Tobacco Use  . Smoking status: Former Smoker    Types: Cigarettes    Last attempt to quit: 04/18/1998    Years since quitting: 21.0  . Smokeless tobacco: Never Used  Substance Use Topics  . Alcohol use: No    Alcohol/week: 0.0 standard drinks    Comment: pt's wife states that he quit drinking in 1997, pt use to drink about 80 beers per week and 900 shots of liquor per week  . Drug use: No     Allergies   Patient has no known allergies.   Review of Systems Review of Systems  Unable to perform ROS: Dementia     Physical Exam Updated Vital Signs BP (!) 123/91   Pulse 96   Temp 98.2 F (36.8 C) (Oral)   Resp 18   SpO2 100%   Physical Exam Vitals signs and nursing note reviewed.    69 year old male, resting comfortably and in no acute distress. Vital signs are significant for borderline elevated diastolic blood pressure. Oxygen saturation is 100%, which is normal. Head is normocephalic and atraumatic. PERRLA, EOMI. Oropharynx is clear. Neck is nontender and  supple without adenopathy or JVD. Back is nontender and there is no CVA tenderness. Lungs are clear without rales, wheezes, or rhonchi. Chest is nontender. Heart has regular rate and rhythm without murmur. Abdomen is soft, flat, nontender without masses or hepatosplenomegaly and peristalsis is normoactive. Genitalia: Circumcised penis.  No blood seen coming from the meatus. Extremities have no cyanosis or edema, full range of motion is present. Skin is warm and dry without rash. Neurologic: Awake, nonverbal but will follow commands, generally confused, cranial nerves are intact, there are no motor or sensory deficits.  ED Treatments / Results  Labs (all labs ordered are listed, but only  abnormal results are displayed) Labs Reviewed  URINE CULTURE  URINALYSIS, ROUTINE W REFLEX MICROSCOPIC   Procedures Procedures   Medications Ordered in ED Medications - No data to display   Initial Impression / Assessment and Plan / ED Course  I have reviewed the triage vital signs and the nursing notes.  Pertinent labs & imaging results that were available during my care of the patient were reviewed by me and considered in my medical decision making (see chart for details).  Urinary retention following removal Foley catheter.  Old records reviewed and Foley catheter been placed on March 5 because of urinary retention.  We will try to reinsert Foley catheter.  Foley catheter was successfully placed.  He is discharged with instructions to follow-up with urology.  Final Clinical Impressions(s) / ED Diagnoses   Final diagnoses:  Urinary retention    ED Discharge Orders    None       Delora Fuel, MD 74/73/40 [phone]

## 2019-04-16 LAB — URINE CULTURE: Culture: NO GROWTH

## 2019-06-16 IMAGING — DX PORTABLE CHEST - 1 VIEW
1 series · 1 of 1 positions shown · non-contrast
Comparison: 12/12/2018

CLINICAL DATA: Fever and altered mental status

EXAM:
PORTABLE CHEST 1 VIEW

[chest ap]
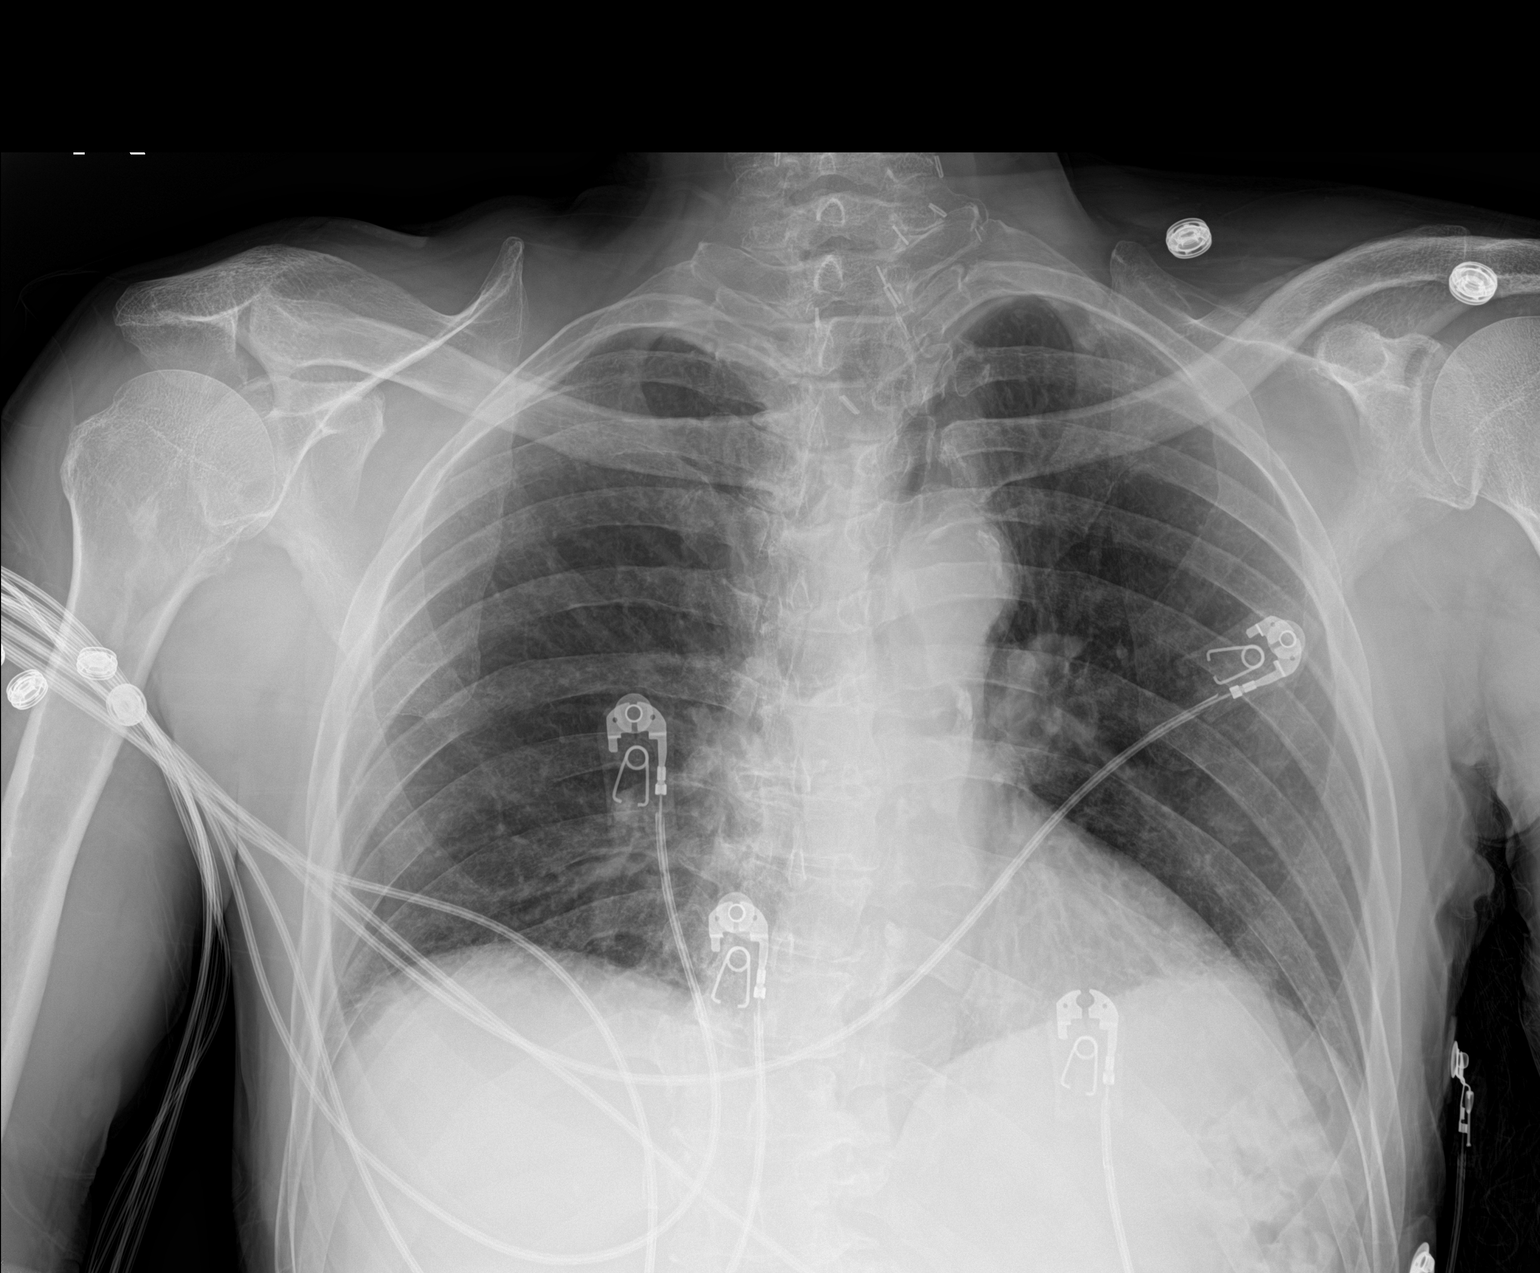

[1 of 1 positions shown; findings below may reference images not displayed]

FINDINGS: No focal airspace disease or effusion. Stable cardiomediastinal
silhouette with aortic atherosclerosis. No pneumothorax.
Postsurgical changes at the neck and thoracic inlet.
IMPRESSION: No active disease.

## 2019-06-24 ENCOUNTER — Encounter: Payer: Self-pay | Admitting: Internal Medicine

## 2019-06-30 DEATH — deceased

## 2021-01-31 IMAGING — US US RENAL
1 series · 14 of 25 positions shown · non-contrast
Comparison: None.

CLINICAL DATA: Acute kidney injury.

EXAM:
RENAL / URINARY TRACT ULTRASOUND COMPLETE

[Series 1: us renal · 0.20mm/px · 14 of 53 slices shown]
[im 1/53]
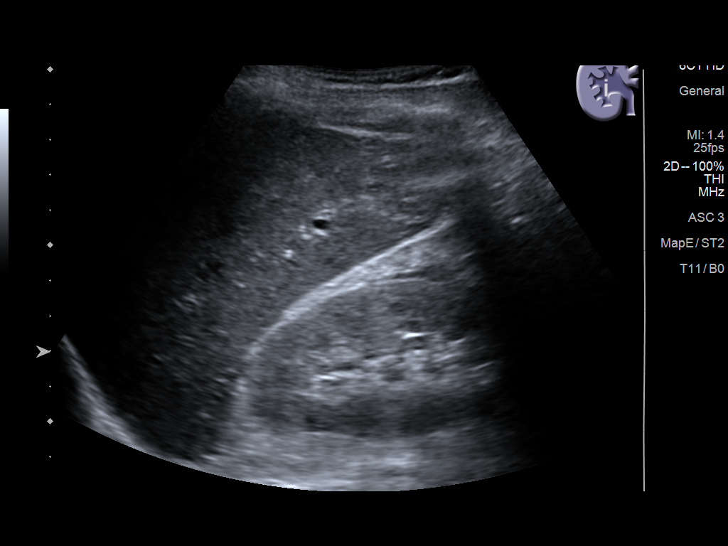
[im 5/53]
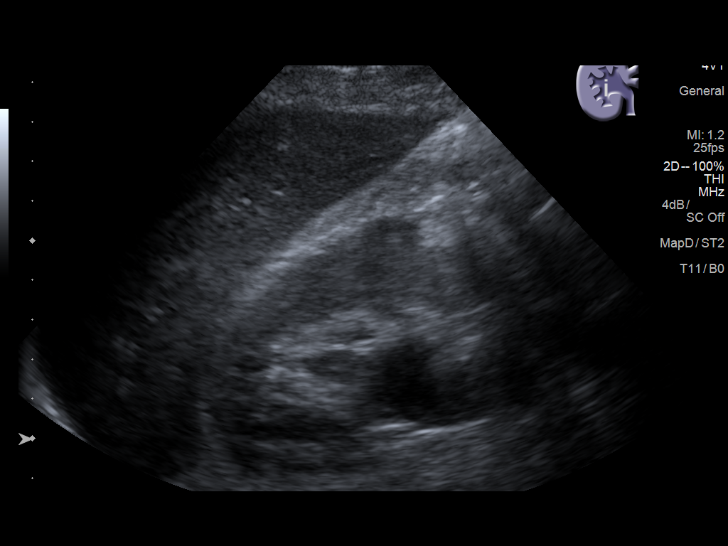
[im 9/53]
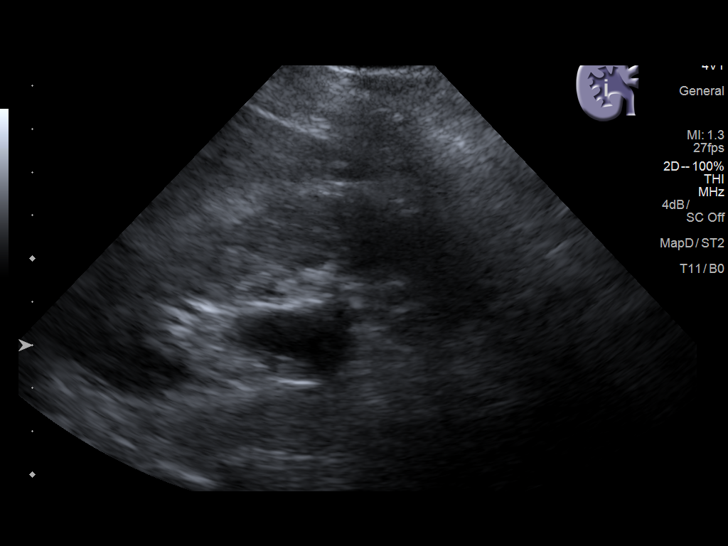
[im 14/53]
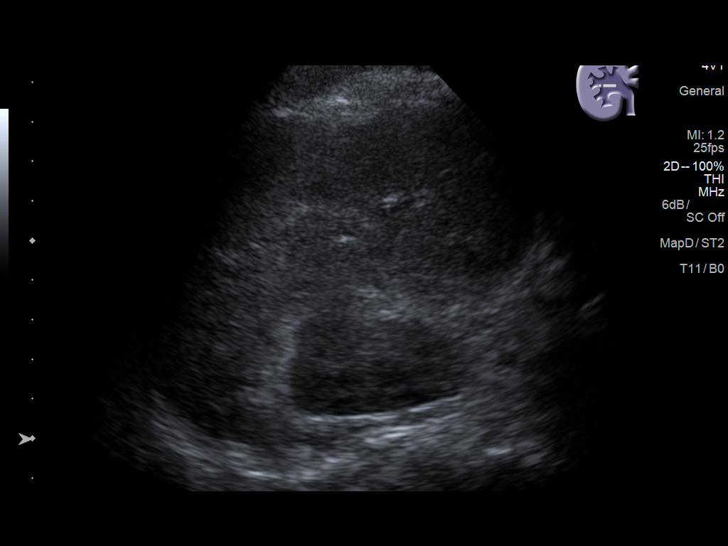
[im 18/53]
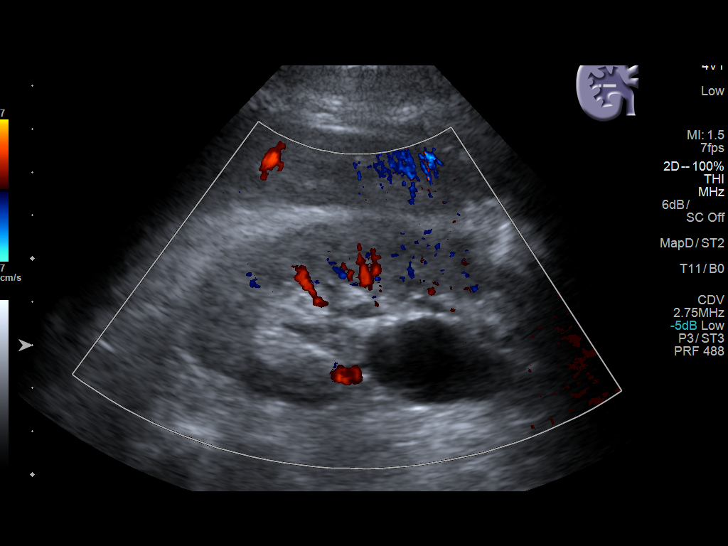
[im 20/53]
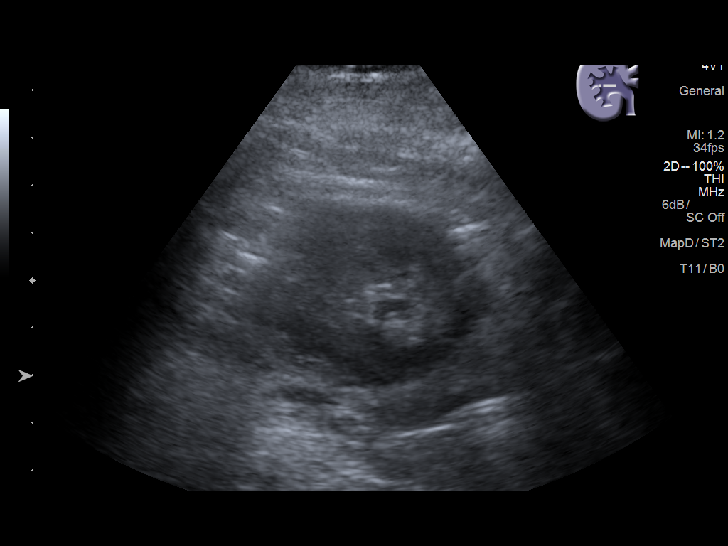
[im 24/53]
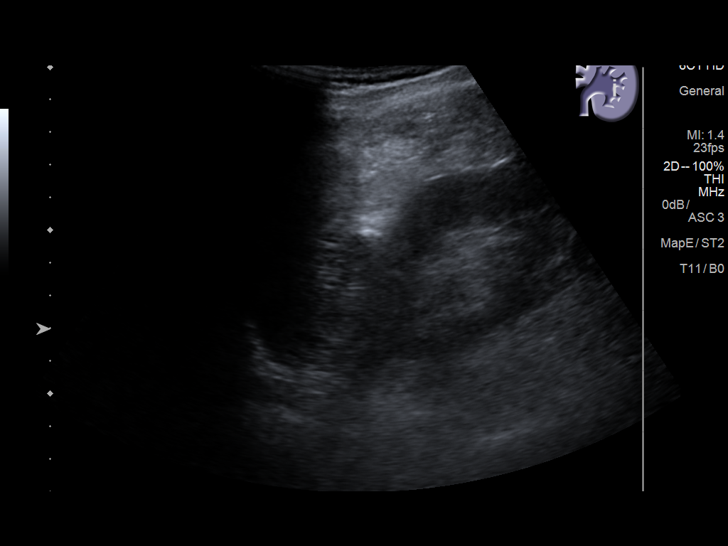
[im 29/53]
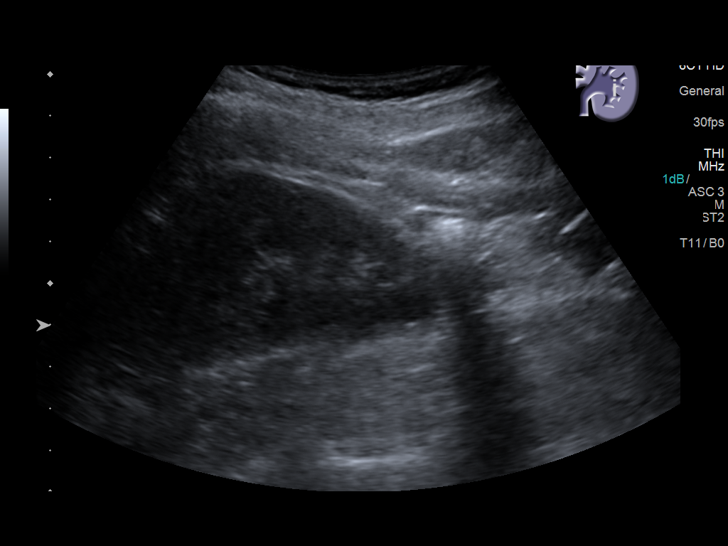
[im 33/53]
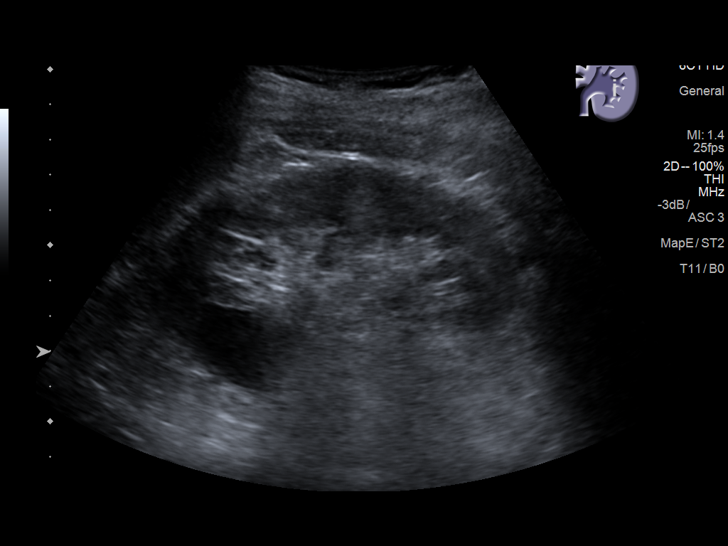
[im 35/53]
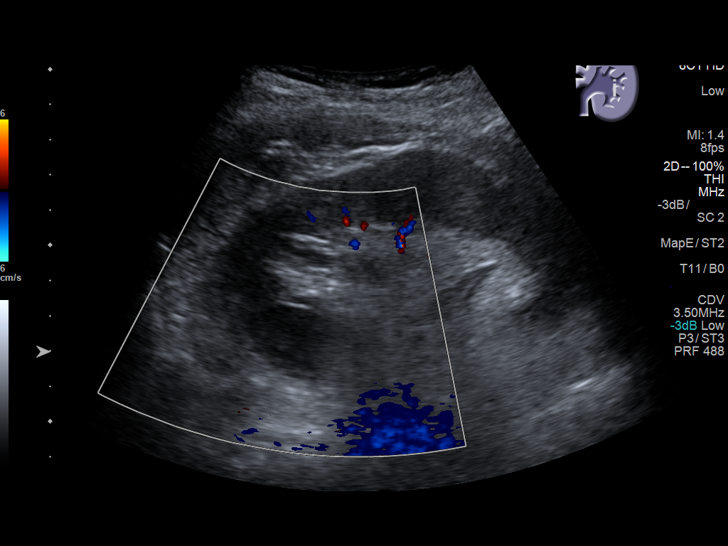
[im 40/53]
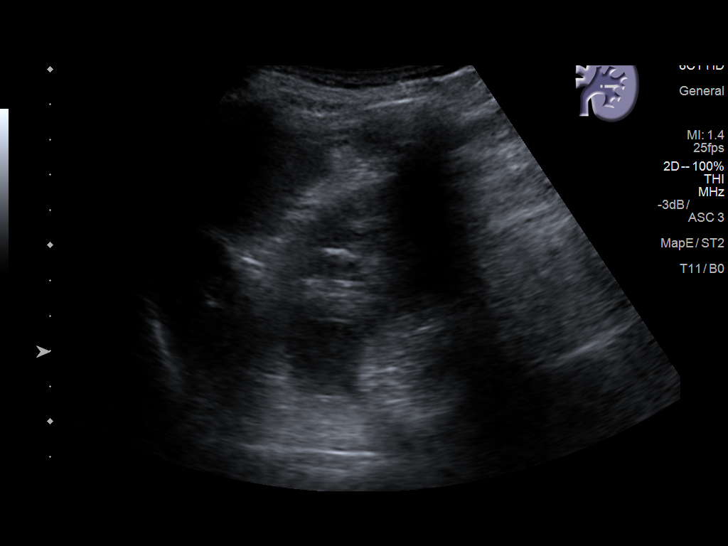
[im 44/53]
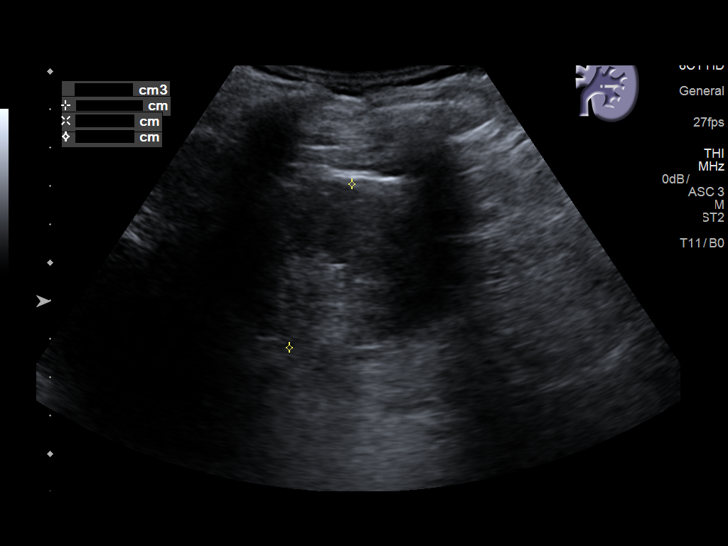
[im 48/53]
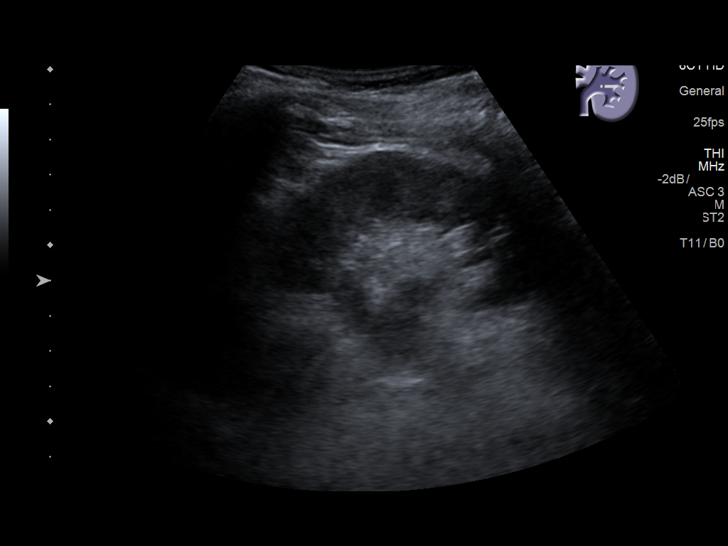
[im 53/53]
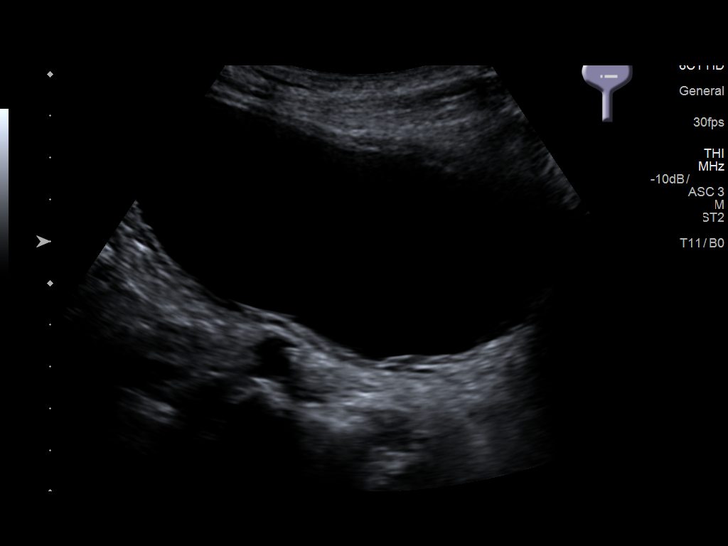

[14 of 25 positions shown; findings below may reference images not displayed]

FINDINGS: Right Kidney:

Renal measurements: 10.1 x 4.8 x 6.5 centimeters = volume: 164.9 mL.
Mild RIGHT hydronephrosis. No solid or cystic mass.

Left Kidney:

Renal measurements: 10.3 x 6.9 x 4.6 centimeters = volume: 171.1 mL.
There is mild LEFT-sided hydronephrosis. Cyst in the UPPER pole
region is 1.9 x 2.3 x 2.0 centimeters.

Bladder:

Appears normal for degree of bladder distention.
IMPRESSION: 1. Bilateral mild hydronephrosis.
2. Consider further evaluation with CT of the abdomen and pelvis
without intravenous contrast.
3. LEFT renal cyst.
